# Patient Record
Sex: Female | Born: 1955 | Race: Black or African American | Hispanic: No | Marital: Single | Smoking: Never smoker
Health system: Southern US, Community
[De-identification: ages and names within clinical notes are randomized; demographics above are authoritative.]

## PROBLEM LIST (undated history)

## (undated) DIAGNOSIS — M810 Age-related osteoporosis without current pathological fracture: Secondary | ICD-10-CM

## (undated) DIAGNOSIS — I10 Essential (primary) hypertension: Secondary | ICD-10-CM

## (undated) DIAGNOSIS — E785 Hyperlipidemia, unspecified: Secondary | ICD-10-CM

## (undated) HISTORY — PX: TONSILLECTOMY: SUR1361

## (undated) HISTORY — PX: KNEE SURGERY: SHX244

## (undated) HISTORY — PX: NASAL SINUS SURGERY: SHX719

## (undated) HISTORY — PX: APPENDECTOMY: SHX54

---

## 2007-11-17 ENCOUNTER — Emergency Department (HOSPITAL_COMMUNITY): Admission: EM | Admit: 2007-11-17 | Discharge: 2007-11-17 | Payer: Self-pay | Admitting: Emergency Medicine

## 2007-12-25 ENCOUNTER — Emergency Department (HOSPITAL_COMMUNITY): Admission: EM | Admit: 2007-12-25 | Discharge: 2007-12-25 | Payer: Self-pay | Admitting: Emergency Medicine

## 2008-09-12 ENCOUNTER — Emergency Department (HOSPITAL_COMMUNITY): Admission: EM | Admit: 2008-09-12 | Discharge: 2008-09-12 | Payer: Self-pay | Admitting: Emergency Medicine

## 2008-11-25 ENCOUNTER — Emergency Department (HOSPITAL_COMMUNITY): Admission: EM | Admit: 2008-11-25 | Discharge: 2008-11-25 | Payer: Self-pay | Admitting: Emergency Medicine

## 2008-12-22 ENCOUNTER — Ambulatory Visit (HOSPITAL_COMMUNITY): Admission: RE | Admit: 2008-12-22 | Discharge: 2008-12-22 | Payer: Self-pay | Admitting: Orthopaedic Surgery

## 2008-12-28 ENCOUNTER — Inpatient Hospital Stay (HOSPITAL_COMMUNITY): Admission: RE | Admit: 2008-12-28 | Discharge: 2008-12-29 | Payer: Self-pay | Admitting: Orthopaedic Surgery

## 2009-02-22 ENCOUNTER — Encounter: Admission: RE | Admit: 2009-02-22 | Discharge: 2009-03-21 | Payer: Self-pay | Admitting: Orthopaedic Surgery

## 2011-02-08 LAB — PROTIME-INR: INR: 1 (ref 0.00–1.49)

## 2011-02-13 LAB — COMPREHENSIVE METABOLIC PANEL
Alkaline Phosphatase: 93 U/L (ref 39–117)
CO2: 27 mEq/L (ref 19–32)
Calcium: 9.6 mg/dL (ref 8.4–10.5)
Chloride: 103 mEq/L (ref 96–112)
Creatinine, Ser: 1.16 mg/dL (ref 0.4–1.2)
GFR calc Af Amer: 59 mL/min — ABNORMAL LOW (ref >60)
GFR calc non Af Amer: 49 mL/min — ABNORMAL LOW (ref >60)
Glucose, Bld: 82 mg/dL (ref 70–99)

## 2011-02-13 LAB — URINALYSIS, ROUTINE W REFLEX MICROSCOPIC
Glucose, UA: NEGATIVE mg/dL
Hgb urine dipstick: NEGATIVE
Protein, ur: NEGATIVE mg/dL
Specific Gravity, Urine: 1.02 (ref 1.005–1.030)
pH: 7.5 (ref 5.0–8.0)

## 2011-02-13 LAB — CBC
MCHC: 35 g/dL (ref 30.0–36.0)
MCV: 91.5 fL (ref 78.0–100.0)
Platelets: 315 10*3/uL (ref 150–400)
RDW: 13.3 % (ref 11.5–15.5)

## 2011-02-13 LAB — APTT: aPTT: 30 seconds (ref 24–37)

## 2011-02-13 LAB — PROTIME-INR
INR: 0.9 (ref 0.00–1.49)
Prothrombin Time: 12.3 seconds (ref 11.6–15.2)

## 2011-03-13 NOTE — Op Note (Signed)
Mikayla Moyer, KINNAIRD               ACCOUNT NO.:  [id number]   MEDICAL RECORD NO.:  [id number]          PATIENT TYPE:  OIB   LOCATION:  5032                         FACILITY:  [facility]   PHYSICIAN:  Mark C. Ophelia Charter, M.D.    DATE OF BIRTH:  [birth year]   DATE OF PROCEDURE:  12/27/2008  DATE OF DISCHARGE:                               OPERATIVE REPORT   PREOPERATIVE DIAGNOSIS:  Right patellar tendon rupture.   POSTOPERATIVE DIAGNOSIS:  Right patellar tendon rupture.   PROCEDURE:  Right patellar tendon repair.   SURGEON:  Mark C. Ophelia Charter, MD   TOURNIQUET TIME:  1 hour 44 minutes.   ANESTHESIA:  General anesthesia plus 20 mL Marcaine local.   DRAINS:  None.   INDICATIONS:  This 55 year old female injured her knee on September 12, 2008 when she fell, is stumbling while ambulating on right leg since,  and was seen in the emergency room by the physicians.  At that time, no  apparent orthopedic referral was made.  She then returned later to my  office 2 weeks ago with palpable defect in patellar tendon and sent for  an MRI confirming patellar tendon rupture.  The tendon was rolled up in  a ball, difficult to determine exactly where it was torn but appeared to  be of the tibial attachment.   PROCEDURE IN DETAIL:  After induction of general anesthesia, 2 grams  Ancef, standard prep and draping, usual impervious stockinette, Coban,  and extremity sheets and drapes were applied.  Leg was wrapped in  Esmarch initially 350 which did not work sufficiently.  Tourniquet was  deflated after less than 5 minutes and the leg elevated and then  reinflated at 450.  This did cause arterial compression.  Midline  incision was made.  Tibial tubercle was exposed.  There was extensive  mass of scar tissue from the rupture of the patella tendon which is now  3-1/2 months old.  Patellar tendon appeared be ruptured in the proximal  third.  There was a fluid-filled sac over the top of the tendon.  The  tendon  sheath with adherent to the tendon and a plane was developed  between the superficial retinaculum, deep retinaculum medially and  laterally going through extensive scar tissue which required sharp  scalpel dissection in order to develop a plane.  Defect in the  retinaculum was palpated almost to the medial collateral ligament and  lateral collateral ligament.  Condyles were visible.  Joint was  irrigated.  A #2 FiberWire was used for a figure-of-eight and horizontal  mattress sutures as well as a Kessler type sutures in the capsule both  medial and lateral.  Tajima modification Kessler sutures were placed in  the patellar tendon region, although it was difficult to actually assess  the patellar tendon as it was bound in a mass of scar tissue after 3  months of attempted healing.  Sutures were placed and using the inferior  pole of the patella and the tibial tubercle as the appropriate regions  for repair, tissue was sewed together.  It was overlapped with multiple  #  2 FiberWire figure-of-eight sutures as well.  Knee was able flexed  to 50 degrees with intact repair and was brought back in extension.  Repeat irrigation was performed.  A 2-0 Vicryl in subcutaneous tissue  and skin staple closure.  Marcaine infiltration 20 mL.  Adaptic, 4 x 4s,  ABDs, Webril, and knee immobilizer was applied.  Instrument count and  needle count was correct.      Mark C. Ophelia Charter, M.D.  Electronically Signed     MCY/MEDQ  D:  12/27/2008  T:  12/27/2008  Job:  [id number]

## 2011-11-13 ENCOUNTER — Other Ambulatory Visit: Payer: Self-pay | Admitting: Internal Medicine

## 2011-11-13 DIAGNOSIS — Z1231 Encounter for screening mammogram for malignant neoplasm of breast: Secondary | ICD-10-CM

## 2011-12-07 ENCOUNTER — Ambulatory Visit
Admission: RE | Admit: 2011-12-07 | Discharge: 2011-12-07 | Disposition: A | Discharge status: Home / Self Care | Payer: Medicare Other | Source: Ambulatory Visit | Attending: Internal Medicine | Admitting: Internal Medicine

## 2011-12-07 DIAGNOSIS — Z1231 Encounter for screening mammogram for malignant neoplasm of breast: Secondary | ICD-10-CM

## 2011-12-24 ENCOUNTER — Emergency Department (HOSPITAL_COMMUNITY)
Admission: EM | Admit: 2011-12-24 | Discharge: 2011-12-24 | Disposition: A | Discharge status: Home / Self Care | Payer: Medicare Other | Attending: Emergency Medicine | Admitting: Emergency Medicine

## 2011-12-24 ENCOUNTER — Encounter (HOSPITAL_COMMUNITY): Payer: Self-pay | Admitting: *Deleted

## 2011-12-24 DIAGNOSIS — J329 Chronic sinusitis, unspecified: Secondary | ICD-10-CM | POA: Insufficient documentation

## 2011-12-24 DIAGNOSIS — R070 Pain in throat: Secondary | ICD-10-CM | POA: Insufficient documentation

## 2011-12-24 DIAGNOSIS — I10 Essential (primary) hypertension: Secondary | ICD-10-CM | POA: Insufficient documentation

## 2011-12-24 DIAGNOSIS — Z79899 Other long term (current) drug therapy: Secondary | ICD-10-CM | POA: Insufficient documentation

## 2011-12-24 DIAGNOSIS — J3489 Other specified disorders of nose and nasal sinuses: Secondary | ICD-10-CM | POA: Insufficient documentation

## 2011-12-24 DIAGNOSIS — R0982 Postnasal drip: Secondary | ICD-10-CM | POA: Insufficient documentation

## 2011-12-24 DIAGNOSIS — H9209 Otalgia, unspecified ear: Secondary | ICD-10-CM | POA: Insufficient documentation

## 2011-12-24 DIAGNOSIS — R059 Cough, unspecified: Secondary | ICD-10-CM | POA: Insufficient documentation

## 2011-12-24 DIAGNOSIS — R51 Headache: Secondary | ICD-10-CM | POA: Insufficient documentation

## 2011-12-24 DIAGNOSIS — R220 Localized swelling, mass and lump, head: Secondary | ICD-10-CM | POA: Insufficient documentation

## 2011-12-24 DIAGNOSIS — R05 Cough: Secondary | ICD-10-CM | POA: Insufficient documentation

## 2011-12-24 HISTORY — DX: Essential (primary) hypertension: I10

## 2011-12-24 MED ORDER — AMOXICILLIN 500 MG PO CAPS: 500.0000 mg | ORAL_CAPSULE | Freq: Three times a day (TID) | ORAL | Status: AC

## 2011-12-24 MED ORDER — PREDNISONE (PAK) 10 MG PO TABS: ORAL_TABLET | ORAL | Status: AC

## 2011-12-24 MED ORDER — TRAMADOL HCL 50 MG PO TABS: 50.0000 mg | ORAL_TABLET | Freq: Four times a day (QID) | ORAL | Status: AC | PRN

## 2011-12-24 NOTE — Discharge Instructions (Signed)
How to clear your sinuses:  Use Afrin (and over-the-counter nasal decongestant spray): 1-2 squirts in each nostril. Wait 10 minutes (while using either warm compresses to face, a humidifier, or taking a hot shower) Then do 2 squirts of fluticasone (prescription)  in each nostril. Do this for 1-2 weeks and that should help significantly with sinus congestion.   Sinusitis Sinuses are air pockets within the bones of your face. The growth of bacteria within a sinus leads to infection. The infection prevents the sinuses from draining. This infection is called sinusitis. SYMPTOMS  There will be different areas of pain depending on which sinuses have become infected.  The maxillary sinuses often produce pain beneath the eyes.   Frontal sinusitis may cause pain in the middle of the forehead and above the eyes.  Other problems (symptoms) include:  Toothaches.   Colored, pus-like (purulent) drainage from the nose.   Swelling, warmth, and tenderness over the sinus areas may be signs of infection.  TREATMENT  Sinusitis is most often determined by an exam.X-rays may be taken. If x-rays have been taken, make sure you obtain your results or find out how you are to obtain them. Your caregiver may give you medications (antibiotics). These are medications that will help kill the bacteria causing the infection. You may also be given a medication (decongestant) that helps to reduce sinus swelling.  HOME CARE INSTRUCTIONS   Only take over-the-counter or prescription medicines for pain, discomfort, or fever as directed by your caregiver.   Drink extra fluids. Fluids help thin the mucus so your sinuses can drain more easily.   Applying either moist heat or ice packs to the sinus areas may help relieve discomfort.   Use saline nasal sprays to help moisten your sinuses. The sprays can be found at your local drugstore.  SEEK IMMEDIATE MEDICAL CARE IF:  You have a fever.   You have increasing pain, severe  headaches, or toothache.   You have nausea, vomiting, or drowsiness.   You develop unusual swelling around the face or trouble seeing.  MAKE SURE YOU:   Understand these instructions.   Will watch your condition.   Will get help right away if you are not doing well or get worse.  Document Released: 10/15/2005 Document Revised: 06/27/2011 Document Reviewed: 05/14/2007 [facility] Patient Information 2012 [place], [place].

## 2011-12-24 NOTE — ED Provider Notes (Signed)
History     CSN: [id number]  Arrival date & time 12/24/11  1204   First MD Initiated Contact with Patient 12/24/11 1253      1:58 PM HPI Patient reports chronic sinus infections 20 years. Reports her latest infection began approximately 3 weeks ago. Symptoms are worse and now has a severe frontal sinus headache, nasal drainage and a productive cough. Denies fever, shortness of breath, chest pain, neck, numbness, tingling, weakness, aphasia, ataxia. Reports trying over-the-counter Mucinex, fluticasone, over-the-counter sinus medication,and allergy medicine. Reports no improvement. Patient is a 56 y.o. female presenting with sinusitis. The history is provided by the patient.  Sinusitis  This is a chronic problem. Episode onset: 3 weeks ago. The problem has been gradually worsening. There has been no fever. The pain is severe. The pain has been worsening since onset. Associated symptoms include congestion, ear pain, sinus pressure, sore throat and cough. Pertinent negatives include no chills, no sweats and no shortness of breath. The treatment provided no relief.    Past Medical History  Diagnosis Date  . Hypertension     Past Surgical History  Procedure Date  . Knee surgery     No family history on file.  History  Substance Use Topics  . Smoking status: Never Smoker   . Smokeless tobacco: Not on file  . Alcohol Use: No    OB History    Grav Para Term Preterm Abortions TAB SAB Ect Mult Living                  Review of Systems  Constitutional: Negative for fever and chills.  HENT: Positive for ear pain, congestion, sore throat, rhinorrhea, postnasal drip and sinus pressure. Negative for trouble swallowing, neck pain and neck stiffness.   Eyes: Negative for photophobia and visual disturbance.  Respiratory: Positive for cough. Negative for shortness of breath and wheezing.   Cardiovascular: Negative for chest pain and leg swelling.  Gastrointestinal: Negative for nausea,  vomiting, abdominal pain and diarrhea.  Neurological: Positive for headaches. Negative for dizziness, speech difficulty, weakness and numbness.  All other systems reviewed and are negative.    Allergies  Review of patient's allergies indicates not on file.  Home Medications   Current Outpatient Rx  Name Route Sig Dispense Refill  . ACETAMINOPHEN 500 MG PO TABS Oral Take 1,000 mg by mouth every 6 (six) hours as needed. pain    . GOODY HEADACHE PO Oral Take 1 packet by mouth every 6 (six) hours. pain    . ESOMEPRAZOLE MAGNESIUM 40 MG PO CPDR Oral Take 40 mg by mouth daily before breakfast.    . FLUTICASONE PROPIONATE 50 MCG/ACT NA SUSP Nasal Place 2 sprays into the nose daily.    . GUAIFENESIN ER 600 MG PO TB12 Oral Take 1,200 mg by mouth 2 (two) times daily.    Marland Kitchen HYDROCOD POLST-CPM POLST ER 10-8 MG PO CP12 Oral Take 1 capsule by mouth every 12 (twelve) hours.    . IBUPROFEN 200 MG PO TABS Oral Take 600 mg by mouth every 6 (six) hours as needed. pain    . LISINOPRIL-HYDROCHLOROTHIAZIDE 20-25 MG PO TABS Oral Take 1 tablet by mouth daily.    Marland Kitchen LORATADINE 10 MG PO TABS Oral Take 10 mg by mouth daily.      BP 163/94  Pulse 99  Temp(Src) 98.2 F (36.8 C) (Oral)  Resp 18  Ht 5\' 6"  (1.676 m)  Wt 280 lb (127.007 kg)  BMI 45.19 kg/m2  SpO2  97%  LMP 11/23/2011  Physical Exam  Vitals reviewed. Constitutional: She is oriented to person, place, and time. Vital signs are normal. She appears well-developed and well-nourished. No distress.  HENT:  Head: Normocephalic and atraumatic. No trismus in the jaw.  Right Ear: Hearing, tympanic membrane, external ear and ear canal normal.  Left Ear: Hearing, tympanic membrane, external ear and ear canal normal.  Nose: Mucosal edema and rhinorrhea present. Right sinus exhibits maxillary sinus tenderness and frontal sinus tenderness. Left sinus exhibits maxillary sinus tenderness and frontal sinus tenderness.  Mouth/Throat: Uvula is midline and  oropharynx is clear and moist. Normal dentition. No dental abscesses or uvula swelling. No oropharyngeal exudate, posterior oropharyngeal edema, posterior oropharyngeal erythema or tonsillar abscesses.  Eyes: Pupils are equal, round, and reactive to light.  Neck: Neck supple.  Pulmonary/Chest: Effort normal.  Neurological: She is alert and oriented to person, place, and time.  Skin: Skin is warm and dry. No rash noted. No erythema. No pallor.  Psychiatric: She has a normal mood and affect. Her behavior is normal.    ED Course  Procedures   MDM   We'll treat for sinus infection. Advised followup with ear nose throat. And I have provided preferable to to chronic nature of sinusitis.       Thomasene Lot, PA-C 12/24/11 1407

## 2011-12-24 NOTE — Progress Notes (Signed)
Pt confirms pcp is [facility]

## 2011-12-24 NOTE — ED Notes (Signed)
Pt states she has been going to doctor for the last 3 weeks for her sinus. Pt states she thinks her cold has gotten worst. Pt c/o feeling congested, headache, and productive cough. Pt denies any chest  Pain.

## 2011-12-24 NOTE — ED Provider Notes (Signed)
Medical screening examination/treatment/procedure(s) were performed by non-physician practitioner and as supervising physician I was immediately available for consultation/collaboration.   Creg Gilmer, MD 12/24/11 1512 

## 2012-02-13 ENCOUNTER — Other Ambulatory Visit: Payer: Self-pay | Admitting: Otolaryngology

## 2012-02-13 ENCOUNTER — Ambulatory Visit
Admission: RE | Admit: 2012-02-13 | Discharge: 2012-02-13 | Disposition: A | Discharge status: Home / Self Care | Payer: Medicare Other | Source: Ambulatory Visit | Attending: Otolaryngology | Admitting: Otolaryngology

## 2012-02-13 DIAGNOSIS — J322 Chronic ethmoidal sinusitis: Secondary | ICD-10-CM

## 2012-02-13 DIAGNOSIS — J320 Chronic maxillary sinusitis: Secondary | ICD-10-CM

## 2012-02-13 DIAGNOSIS — R51 Headache: Secondary | ICD-10-CM

## 2012-02-13 DIAGNOSIS — J329 Chronic sinusitis, unspecified: Secondary | ICD-10-CM

## 2012-02-20 ENCOUNTER — Other Ambulatory Visit: Payer: Self-pay | Admitting: Otolaryngology

## 2012-07-28 ENCOUNTER — Emergency Department (HOSPITAL_COMMUNITY)
Admission: EM | Admit: 2012-07-28 | Discharge: 2012-07-29 | Disposition: A | Discharge status: Home / Self Care | Payer: Medicare Other | Attending: Emergency Medicine | Admitting: Emergency Medicine

## 2012-07-28 ENCOUNTER — Encounter (HOSPITAL_COMMUNITY): Payer: Self-pay | Admitting: *Deleted

## 2012-07-28 ENCOUNTER — Emergency Department (HOSPITAL_COMMUNITY): Payer: Medicare Other

## 2012-07-28 DIAGNOSIS — T148XXA Other injury of unspecified body region, initial encounter: Secondary | ICD-10-CM

## 2012-07-28 DIAGNOSIS — I10 Essential (primary) hypertension: Secondary | ICD-10-CM | POA: Insufficient documentation

## 2012-07-28 DIAGNOSIS — M25559 Pain in unspecified hip: Secondary | ICD-10-CM | POA: Insufficient documentation

## 2012-07-28 DIAGNOSIS — T1490XA Injury, unspecified, initial encounter: Secondary | ICD-10-CM | POA: Insufficient documentation

## 2012-07-28 DIAGNOSIS — M549 Dorsalgia, unspecified: Secondary | ICD-10-CM | POA: Insufficient documentation

## 2012-07-28 DIAGNOSIS — M79609 Pain in unspecified limb: Secondary | ICD-10-CM | POA: Insufficient documentation

## 2012-07-28 DIAGNOSIS — M25519 Pain in unspecified shoulder: Secondary | ICD-10-CM | POA: Insufficient documentation

## 2012-07-28 DIAGNOSIS — M542 Cervicalgia: Secondary | ICD-10-CM | POA: Insufficient documentation

## 2012-07-28 MED ORDER — ACETAMINOPHEN 500 MG PO TABS
1000.0000 mg | ORAL_TABLET | Freq: Once | ORAL | Status: AC
  Administered 2012-07-28: 1000 mg via ORAL
  Filled 2012-07-28: qty 2

## 2012-07-28 NOTE — ED Notes (Signed)
Patient transported to X-ray 

## 2012-07-28 NOTE — ED Notes (Signed)
Patient transported to CT 
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
## 2012-07-29 ENCOUNTER — Emergency Department (HOSPITAL_COMMUNITY): Payer: Medicare Other

## 2012-07-29 MED ORDER — DIAZEPAM 5 MG PO TABS: 5.0000 mg | ORAL_TABLET | Freq: Two times a day (BID) | ORAL | Status: DC

## 2012-07-29 MED ORDER — IBUPROFEN 600 MG PO TABS: 600.0000 mg | ORAL_TABLET | Freq: Four times a day (QID) | ORAL | Status: DC | PRN

## 2012-07-29 NOTE — ED Provider Notes (Signed)
Medical screening examination/treatment/procedure(s) were performed by non-physician practitioner and as supervising physician I was immediately available for consultation/collaboration.   Carleene Cooper III, MD 07/29/12 1210

## 2012-08-07 ENCOUNTER — Emergency Department (HOSPITAL_COMMUNITY)
Admission: EM | Admit: 2012-08-07 | Discharge: 2012-08-07 | Disposition: A | Discharge status: Home / Self Care | Payer: Medicare Other | Attending: Emergency Medicine | Admitting: Emergency Medicine

## 2012-08-07 ENCOUNTER — Encounter (HOSPITAL_COMMUNITY): Payer: Self-pay | Admitting: *Deleted

## 2012-08-07 DIAGNOSIS — I10 Essential (primary) hypertension: Secondary | ICD-10-CM | POA: Insufficient documentation

## 2012-08-07 DIAGNOSIS — H00019 Hordeolum externum unspecified eye, unspecified eyelid: Secondary | ICD-10-CM | POA: Insufficient documentation

## 2012-08-07 MED ORDER — ERYTHROMYCIN 5 MG/GM OP OINT
TOPICAL_OINTMENT | Freq: Four times a day (QID) | OPHTHALMIC | Status: DC
  Administered 2012-08-07: 23:00:00 via OPHTHALMIC
  Filled 2012-08-07: qty 1

## 2012-08-07 NOTE — ED Notes (Signed)
Patient's left  eye is swollen and the left side hurts.  Patient ate some mixed nuts and she thinks she is having a reaction to the nuts.  No oral swelling or respiratory problems

## 2012-08-07 NOTE — ED Provider Notes (Signed)
History     CSN: [id number]  Arrival date & time 08/07/12  2046   First MD Initiated Contact with Patient 08/07/12 2157      Chief Complaint  Patient presents with  . Allergic Reaction     Patient is a 56 y.o. female presenting with eye pain. The history is provided by the patient.  Eye Pain This is a new problem. Episode onset: today. The problem occurs constantly. The problem has been gradually worsening. Pertinent negatives include no shortness of breath. Exacerbated by: palpation. The symptoms are relieved by rest. She has tried rest for the symptoms.  pt reports left eye pain/swelling.  She thinks it is an allergic reaction.  No fever/vomiting.  No difficulty breathing.  No tongue/throat swelling  Past Medical History  Diagnosis Date  . Hypertension     Past Surgical History  Procedure Date  . Knee surgery     No family history on file.  History  Substance Use Topics  . Smoking status: Never Smoker   . Smokeless tobacco: Not on file  . Alcohol Use: No    OB History    Grav Para Term Preterm Abortions TAB SAB Ect Mult Living                  Review of Systems  Eyes: Positive for pain.  Respiratory: Negative for shortness of breath.     Allergies  Review of patient's allergies indicates no known allergies.  Home Medications   Current Outpatient Rx  Name Route Sig Dispense Refill  . ACETAMINOPHEN 500 MG PO TABS Oral Take 1,000 mg by mouth every 6 (six) hours as needed. For pain    . AMLODIPINE BESYLATE 10 MG PO TABS Oral Take 10 mg by mouth daily.    Marlin Canary HEADACHE PO Oral Take 1 packet by mouth every 6 (six) hours. pain    . IBUPROFEN 600 MG PO TABS Oral Take 600 mg by mouth every 6 (six) hours as needed. For pain    . LORATADINE 10 MG PO TABS Oral Take 10 mg by mouth at bedtime as needed. For allergies    . OLMESARTAN MEDOXOMIL-HCTZ 20-12.5 MG PO TABS Oral Take 1 tablet by mouth daily.    Marland Kitchen PRAVASTATIN SODIUM 40 MG PO TABS Oral Take 40 mg by mouth  at bedtime.    Marland Kitchen VITAMIN D (ERGOCALCIFEROL) 50000 UNITS PO CAPS Oral Take 50,000 Units by mouth every 7 (seven) days. On Mondays      BP 181/98  Pulse 91  Temp 98.3 F (36.8 C) (Oral)  Resp 20  SpO2 97%  LMP 07/28/2012  Physical Exam CONSTITUTIONAL: Well developed/well nourished HEAD AND FACE: Normocephalic/atraumatic EYES: EOMI/PERRL.  Hordeolum noted to left lower eyelid.  Erythema noted and tenderness.  No erythema/proptosis noted ENMT: Mucous membranes moist, no angioedema NECK: supple no meningeal signs CV: S1/S2 noted LUNGS: Lungs are clear to auscultation bilaterally, no apparent distress ABDOMEN: soft, nontender, no rebound or guarding NEURO: Pt is awake/alert, moves all extremitiesx4 EXTREMITIES: pulses normal, full ROM SKIN: warm, color normal PSYCH: no abnormalities of mood noted  ED Course  Procedures   1. Hordeolum       MDM  Nursing notes including past medical history and social history reviewed and considered in documentation         Joya Gaskins, MD 08/07/12 2357

## 2012-10-13 ENCOUNTER — Observation Stay (HOSPITAL_COMMUNITY)
Admission: EM | Admit: 2012-10-13 | Discharge: 2012-10-13 | Disposition: A | Discharge status: Home / Self Care | Payer: Medicare Other | Attending: Emergency Medicine | Admitting: Emergency Medicine

## 2012-10-13 ENCOUNTER — Encounter (HOSPITAL_COMMUNITY): Payer: Self-pay | Admitting: *Deleted

## 2012-10-13 ENCOUNTER — Observation Stay (HOSPITAL_COMMUNITY): Payer: Medicare Other

## 2012-10-13 DIAGNOSIS — Z7902 Long term (current) use of antithrombotics/antiplatelets: Secondary | ICD-10-CM | POA: Insufficient documentation

## 2012-10-13 DIAGNOSIS — M25559 Pain in unspecified hip: Principal | ICD-10-CM | POA: Insufficient documentation

## 2012-10-13 DIAGNOSIS — M545 Low back pain, unspecified: Secondary | ICD-10-CM | POA: Insufficient documentation

## 2012-10-13 DIAGNOSIS — M549 Dorsalgia, unspecified: Secondary | ICD-10-CM

## 2012-10-13 DIAGNOSIS — M25569 Pain in unspecified knee: Secondary | ICD-10-CM | POA: Insufficient documentation

## 2012-10-13 DIAGNOSIS — Y92009 Unspecified place in unspecified non-institutional (private) residence as the place of occurrence of the external cause: Secondary | ICD-10-CM | POA: Insufficient documentation

## 2012-10-13 DIAGNOSIS — I10 Essential (primary) hypertension: Secondary | ICD-10-CM | POA: Insufficient documentation

## 2012-10-13 DIAGNOSIS — W19XXXA Unspecified fall, initial encounter: Secondary | ICD-10-CM | POA: Insufficient documentation

## 2012-10-13 DIAGNOSIS — M79609 Pain in unspecified limb: Secondary | ICD-10-CM | POA: Insufficient documentation

## 2012-10-13 DIAGNOSIS — M79676 Pain in unspecified toe(s): Secondary | ICD-10-CM

## 2012-10-13 DIAGNOSIS — Z79899 Other long term (current) drug therapy: Secondary | ICD-10-CM | POA: Insufficient documentation

## 2012-10-13 MED ORDER — HYDROCODONE-ACETAMINOPHEN 5-325 MG PO TABS: 1.0000 | ORAL_TABLET | Freq: Four times a day (QID) | ORAL | Status: DC | PRN

## 2012-10-13 MED ORDER — HYDROMORPHONE HCL PF 1 MG/ML IJ SOLN
1.0000 mg | Freq: Once | INTRAMUSCULAR | Status: AC
  Administered 2012-10-13: 1 mg via INTRAVENOUS
  Filled 2012-10-13: qty 1

## 2012-10-13 NOTE — Progress Notes (Signed)
Orthopedic Tech Progress Note Patient Details:  Mikayla Moyer 1955/12/11 [id number] Post op shoe applied to Left LE with care instructions. Ortho Devices Type of Ortho Device: Postop shoe/boot Ortho Device/Splint Location: Left Ortho Device/Splint Interventions: Application   Asia R Thompson 10/13/2012, 3:32 PM

## 2012-10-13 NOTE — ED Provider Notes (Signed)
Medical screening examination/treatment/procedure(s) were performed by non-physician practitioner and as supervising physician I was immediately available for consultation/collaboration.   Glynn Octave, MD 10/13/12 [phone]

## 2012-10-13 NOTE — ED Provider Notes (Signed)
History   This chart was scribed for Doug Sou, MD by Charolett Bumpers, ED Scribe. The patient was seen in room TR07C/TR07C. Patient's care was started at 1040.   CSN: [id number]  Arrival date & time 10/13/12  1005  First MD Initiated Contact with Patient 10/13/2012 1040    Chief Complaint  Patient presents with  . Fall    The history is provided by the patient. No language interpreter was used.  Mikayla Moyer is a 56 y.o. female who presents to the Emergency Department complaining of constant, moderate left hip, knee and foot pain after falling this morning. She states she fell while going to the bathroom after her left knee gave out. She states she has been ambulatory but with a limp. Her symptoms are aggravated with weight bearing. She also complains of lower back pain. She states she has a h/o right knee surgery preformed by Dr. Ophelia Charter and a h/o HTN. She has not tried anything for her symptoms.  Pain is severe, worse with movement improved with remaining still. No treatment prior to coming here. She denies any tobacco, drug and alcohol use.    Past Medical History  Diagnosis Date  . Hypertension     Past Surgical History  Procedure Date  . Knee surgery     History reviewed. No pertinent family history.  History  Substance Use Topics  . Smoking status: Never Smoker   . Smokeless tobacco: Not on file  . Alcohol Use: No    OB History    Grav Para Term Preterm Abortions TAB SAB Ect Mult Living                  Review of Systems  Constitutional: Negative.   HENT: Negative.   Respiratory: Negative.   Cardiovascular: Negative.   Gastrointestinal: Negative.   Musculoskeletal: Positive for back pain and arthralgias.       Left hip, knee and foot pain.   Skin: Negative.   Neurological: Negative.   Hematological: Negative.   Psychiatric/Behavioral: Negative.     Allergies  Review of patient's allergies indicates no known allergies.  Home Medications    Current Outpatient Rx  Name  Route  Sig  Dispense  Refill  . ACETAMINOPHEN 500 MG PO TABS   Oral   Take 1,000 mg by mouth every 6 (six) hours as needed. For pain         . AMLODIPINE BESYLATE 10 MG PO TABS   Oral   Take 10 mg by mouth daily.         Marlin Canary HEADACHE PO   Oral   Take 1 packet by mouth every 6 (six) hours. pain         . IBUPROFEN 600 MG PO TABS   Oral   Take 600 mg by mouth every 6 (six) hours as needed. For pain         . LORATADINE 10 MG PO TABS   Oral   Take 10 mg by mouth at bedtime as needed. For allergies         . OLMESARTAN MEDOXOMIL-HCTZ 20-12.5 MG PO TABS   Oral   Take 1 tablet by mouth daily.         Marland Kitchen PRAVASTATIN SODIUM 40 MG PO TABS   Oral   Take 40 mg by mouth at bedtime.         Marland Kitchen VITAMIN D (ERGOCALCIFEROL) 50000 UNITS PO CAPS   Oral   Take 50,000 Units  by mouth every 7 (seven) days. On Mondays           BP 154/94  Pulse 96  Temp 98 F (36.7 C) (Oral)  Resp 22  SpO2 96%  LMP 09/28/2012  Physical Exam  Nursing note and vitals reviewed. Constitutional: She appears well-developed and well-nourished. She appears distressed.       Marland Kitchen Appears uncomfortable  HENT:  Head: Normocephalic and atraumatic.  Eyes: Conjunctivae normal are normal. Pupils are equal, round, and reactive to light.  Neck: Neck supple. No tracheal deviation present. No thyromegaly present.  Cardiovascular: Normal rate and regular rhythm.   No murmur heard. Pulmonary/Chest: Effort normal and breath sounds normal.  Abdominal: Soft. Bowel sounds are normal. She exhibits no distension. There is no tenderness.       Obese  Musculoskeletal: Normal range of motion. She exhibits tenderness. She exhibits no edema.       Left lower extremity Pain with internal rotation of the left hip, No shortening. No external rotation knee without swelling deformity or tenderness. Foot minimally tender at 15  no swelling. Back tender lumbar spine no deformity all other  extremities are contusion abrasion or tenderness neurovascularly intact  Neurological: She is alert. Coordination normal.  Skin: Skin is warm and dry. No rash noted.  Psychiatric: She has a normal mood and affect.    ED Course  Procedures (including critical care time)  DIAGNOSTIC STUDIES: Oxygen Saturation is 96% on room air, adequate by my interpretation.    COORDINATION OF CARE:  10:43-Discussed planned course of treatment with the patient including including pain medication and x-rays, who is agreeable at this time. Pt did not drive herself to ED.    Labs Reviewed - No data to display No results found.   No diagnosis found.    MDM  Plan analgesia. Plain x-rays of hip and lumbar spine. If x-rays negative patient will need further imaging such as MRI to rule out occult rib fracture     I personally performed the services described in this documentation, which was scribed in my presence. The recorded information has been reviewed and is accurate.      Doug Sou, MD 10/13/12 1050

## 2012-10-13 NOTE — ED Notes (Signed)
Reports getting up to restroom this am, her knees gave out and she had a fall. Having pain to left hip, knee and foot.

## 2012-10-13 NOTE — ED Notes (Signed)
ORTHO IS AWARE PT NEEDS POST OP SHOE

## 2012-10-13 NOTE — ED Provider Notes (Signed)
This is a 56 year old female, who presents emergency department with chief complaint of fall. She was initially seen by Dr. Rennis Chris in fast track. She was moved to CDU to await imaging. Dr. Rennis Chris tells me that if the hip x-ray is negative, that we will need to order an MRI of the hip to rule out a cold fracture. Patient's pain is currently controlled. She denies symptoms while at rest. She states that the pain will come back if she notes.  PE: Gen: A&O x4 HEENT: PERRL, EOM intact CHEST: RRR, no m/r/g LUNGS: CTAB, no w/r/r ABD: BS x 4, ND/NT EXT: Low back pain, low back and paraspinal muscles tender to palpation, left hip tender to palpation, range of motion and strength deferred secondary to pain. NEURO: Sensation and strength intact bilaterally  Plan: Imaging, discharge pending.  2:11 PM Plain films are negative. Will order MRI of left hip.  Results for orders placed during the hospital encounter of 12/28/08  [facility]      Component Value Range   Prothrombin Time 13.5  11.6 - 15.2 seconds   INR 1.0  0.00 - 1.49  CBC      Component Value Range   WBC 8.0  4.0 - 10.5 K/uL   RBC 4.38  3.87 - 5.11 MIL/uL   Hemoglobin 14.0  12.0 - 15.0 g/dL   HCT 40.9  81.1 - 91.4 %   MCV 91.5  78.0 - 100.0 fL   MCHC 35.0  30.0 - 36.0 g/dL   RDW 78.2  95.6 - 21.3 %   Platelets 315  150 - 400 K/uL  COMPREHENSIVE METABOLIC PANEL      Component Value Range   Sodium 136  135 - 145 mEq/L   Potassium 4.3  3.5 - 5.1 mEq/L   Chloride 103  96 - 112 mEq/L   CO2 27  19 - 32 mEq/L   Glucose, Bld 82  70 - 99 mg/dL   BUN 11  6 - 23 mg/dL   Creatinine, Ser 0.86  0.4 - 1.2 mg/dL   Calcium 9.6  8.4 - 57.8 mg/dL   Total Protein 7.4  6.0 - 8.3 g/dL   Albumin 3.5  3.5 - 5.2 g/dL   AST 21  0 - 37 U/L   ALT 25  0 - 35 U/L   Alkaline Phosphatase 93  39 - 117 U/L   Total Bilirubin 0.5  0.3 - 1.2 mg/dL   GFR calc non Af Amer 49 (*) >60 mL/min   GFR calc Af Amer   (*) >60 mL/min   Value: 59          The eGFR has been calculated     using the MDRD equation.     This calculation has not been     validated in all clinical     situations.     eGFR's persistently     <60 mL/min signify     possible Chronic Kidney Disease.  PROTIME-INR      Component Value Range   Prothrombin Time 12.3  11.6 - 15.2 seconds   INR 0.9  0.00 - 1.49  APTT      Component Value Range   aPTT 30  24 - 37 seconds  URINALYSIS, ROUTINE W REFLEX MICROSCOPIC      Component Value Range   Color, Urine YELLOW  YELLOW   APPearance CLEAR  CLEAR   Specific Gravity, Urine 1.020  1.005 - 1.030   pH 7.5  5.0 -  8.0   Glucose, UA NEGATIVE  NEGATIVE mg/dL   Hgb urine dipstick NEGATIVE  NEGATIVE   Bilirubin Urine NEGATIVE  NEGATIVE   Ketones, ur NEGATIVE  NEGATIVE mg/dL   Protein, ur NEGATIVE  NEGATIVE mg/dL   Urobilinogen, UA 1.0  0.0 - 1.0 mg/dL   Nitrite NEGATIVE  NEGATIVE   Leukocytes, UA    NEGATIVE   Value: NEGATIVE MICROSCOPIC NOT DONE ON URINES WITH NEGATIVE PROTEIN, BLOOD, LEUKOCYTES, NITRITE, OR GLUCOSE <1000 mg/dL.   Dg Lumbar Spine Complete  10/13/2012  *RADIOLOGY REPORT*  Clinical Data: Larey Seat.  Back pain.  LUMBAR SPINE - COMPLETE 4+ VIEW  Comparison: None  Findings: The lateral film demonstrates normal alignment. Vertebral bodies and disc spaces are maintained.  No acute bony findings.  Normal alignment of the facet joints and no pars defects.  The visualized bony pelvis in intact.  IMPRESSION: Normal alignment and no acute bony findings.  Minimal degenerative changes.   Original Report Authenticated By: Rudie Meyer, M.D.    Dg Hip Complete Left  10/13/2012  *RADIOLOGY REPORT*  Clinical Data: Larey Seat.  Left hip pain.  LEFT HIP - COMPLETE 2+ VIEW  Comparison: None  Findings: Both hips are normally located.  Minimal degenerative changes bilaterally.  No acute fracture or plain film evidence of avascular necrosis.  The pubic symphysis and SI joints are intact.  IMPRESSION: No acute bony findings.   Original Report  Authenticated By: Rudie Meyer, M.D.    Mr Hip Left Wo Contrast  10/13/2012  *RADIOLOGY REPORT*  Clinical Data: Status post fall.  Left hip, knee and foot pain.  MRI OF THE LEFT HIP WITHOUT CONTRAST  Technique:  Multiplanar, multisequence MR imaging was performed. No intravenous contrast was administered.  Comparison: Plain films earlier this same date.  Findings: There is no fracture or dislocation of either of hip or the remainder the pelvis.  No avascular necrosis of the hips is identified.  There is no hip joint effusion.  No evidence of bursitis is seen.  There is no notable degenerative disease about the hips.  No labral tear is identified.  Visualized lower lumbar spine is unremarkable.  Imaged muscles and tendons appear normal. Visualized intrapelvic contents appear normal.  IMPRESSION: Negative exam.   Original Report Authenticated By: Holley Dexter, M.D.     3:00 PM MR is negative. I'm going to discharge the patient to home with orthopedic or primary care followup. I will give the patient a postop shoe for her toe pain. Will give the patient pain medicine and crutches. Patient understands and agrees with the plan. She is stable and ready for discharge.  3:06 PM Patient has a walker at home, she will use this instead of crutches.    Roxy Horseman, PA-C 10/13/12 1506

## 2013-01-27 ENCOUNTER — Other Ambulatory Visit: Payer: Self-pay

## 2013-01-27 DIAGNOSIS — Z1231 Encounter for screening mammogram for malignant neoplasm of breast: Secondary | ICD-10-CM

## 2013-02-18 ENCOUNTER — Emergency Department (HOSPITAL_COMMUNITY): Payer: Medicare Other

## 2013-02-18 ENCOUNTER — Emergency Department (HOSPITAL_COMMUNITY)
Admission: EM | Admit: 2013-02-18 | Discharge: 2013-02-18 | Disposition: A | Discharge status: Home / Self Care | Payer: Medicare Other | Attending: Emergency Medicine | Admitting: Emergency Medicine

## 2013-02-18 ENCOUNTER — Encounter (HOSPITAL_COMMUNITY): Payer: Self-pay | Admitting: *Deleted

## 2013-02-18 DIAGNOSIS — R059 Cough, unspecified: Secondary | ICD-10-CM | POA: Insufficient documentation

## 2013-02-18 DIAGNOSIS — M79602 Pain in left arm: Secondary | ICD-10-CM

## 2013-02-18 DIAGNOSIS — R05 Cough: Secondary | ICD-10-CM | POA: Insufficient documentation

## 2013-02-18 DIAGNOSIS — Z8739 Personal history of other diseases of the musculoskeletal system and connective tissue: Secondary | ICD-10-CM | POA: Insufficient documentation

## 2013-02-18 DIAGNOSIS — M549 Dorsalgia, unspecified: Secondary | ICD-10-CM | POA: Insufficient documentation

## 2013-02-18 DIAGNOSIS — R0789 Other chest pain: Secondary | ICD-10-CM

## 2013-02-18 DIAGNOSIS — R071 Chest pain on breathing: Secondary | ICD-10-CM | POA: Insufficient documentation

## 2013-02-18 DIAGNOSIS — M79609 Pain in unspecified limb: Secondary | ICD-10-CM | POA: Insufficient documentation

## 2013-02-18 DIAGNOSIS — Z79899 Other long term (current) drug therapy: Secondary | ICD-10-CM | POA: Insufficient documentation

## 2013-02-18 DIAGNOSIS — I10 Essential (primary) hypertension: Secondary | ICD-10-CM | POA: Insufficient documentation

## 2013-02-18 DIAGNOSIS — E785 Hyperlipidemia, unspecified: Secondary | ICD-10-CM | POA: Insufficient documentation

## 2013-02-18 HISTORY — DX: Hyperlipidemia, unspecified: E78.5

## 2013-02-18 HISTORY — DX: Age-related osteoporosis without current pathological fracture: M81.0

## 2013-02-18 LAB — URINALYSIS, ROUTINE W REFLEX MICROSCOPIC
Bilirubin Urine: NEGATIVE
Glucose, UA: NEGATIVE mg/dL
Ketones, ur: NEGATIVE mg/dL
Nitrite: NEGATIVE
pH: 7 (ref 5.0–8.0)

## 2013-02-18 LAB — CBC WITH DIFFERENTIAL/PLATELET
Basophils Absolute: 0 10*3/uL (ref 0.0–0.1)
Eosinophils Absolute: 0.2 10*3/uL (ref 0.0–0.7)
Eosinophils Relative: 3 % (ref 0–5)
HCT: 37.7 % (ref 36.0–46.0)
Lymphocytes Relative: 37 % (ref 12–46)
MCH: 30.4 pg (ref 26.0–34.0)
MCV: 87.5 fL (ref 78.0–100.0)
Monocytes Absolute: 0.5 10*3/uL (ref 0.1–1.0)
RDW: 13 % (ref 11.5–15.5)
WBC: 6.5 10*3/uL (ref 4.0–10.5)

## 2013-02-18 LAB — POCT I-STAT, CHEM 8
Creatinine, Ser: 1 mg/dL (ref 0.50–1.10)
HCT: 40 % (ref 36.0–46.0)
Hemoglobin: 13.6 g/dL (ref 12.0–15.0)
Potassium: 3.7 mEq/L (ref 3.5–5.1)
Sodium: 140 mEq/L (ref 135–145)

## 2013-02-18 LAB — RAPID URINE DRUG SCREEN, HOSP PERFORMED
Amphetamines: NOT DETECTED
Tetrahydrocannabinol: NOT DETECTED

## 2013-02-18 LAB — D-DIMER, QUANTITATIVE: D-Dimer, Quant: <0.27 ug/mL-FEU (ref 0.00–0.48)

## 2013-02-18 LAB — POCT I-STAT TROPONIN I

## 2013-02-18 MED ORDER — HYDROCODONE-ACETAMINOPHEN 5-325 MG PO TABS
2.0000 | ORAL_TABLET | Freq: Once | ORAL | Status: AC
  Administered 2013-02-18: 2 via ORAL
  Filled 2013-02-18: qty 2

## 2013-02-18 MED ORDER — HYDROCODONE-ACETAMINOPHEN 5-325 MG PO TABS: 1.0000 | ORAL_TABLET | ORAL | Status: DC | PRN

## 2013-02-18 NOTE — ED Provider Notes (Signed)
History     CSN: [id number]  Arrival date & time 02/18/13  4098   First MD Initiated Contact with Patient 02/18/13 0703      Chief Complaint  Patient presents with  . Chest Pain    (Consider location/radiation/quality/duration/timing/severity/associated sxs/prior treatment) HPI Comments: Mikayla Moyer is a 57 y.o. Female who complains of pain in left arm for 2 days. The pain is sharp and worse with movement. Pain radiates to her left chest. She tried taking aspirin without relief. There has been no trauma. She has had mild non-effective cough. She denies shortness of breath. No headache, weakness, or dizziness. She has mild back pain, which is ongoing. There's been no dyspnea, syncope, or syncope. She denies extremity pain, gait disorder, bowel or urinary changes. There are no other modifying factors  Patient is a 57 y.o. female presenting with chest pain. The history is provided by the patient.  Chest Pain   Past Medical History  Diagnosis Date  . Hypertension   . Hyperlipidemia   . Osteoporosis     Past Surgical History  Procedure Laterality Date  . Knee surgery    . Tonsillectomy    . Appendectomy    . Nasal sinus surgery      No family history on file.  History  Substance Use Topics  . Smoking status: Never Smoker   . Smokeless tobacco: Not on file  . Alcohol Use: No    OB History   Grav Para Term Preterm Abortions TAB SAB Ect Mult Living                  Review of Systems  Cardiovascular: Positive for chest pain.  All other systems reviewed and are negative.    Allergies  Review of patient's allergies indicates no known allergies.  Home Medications   Current Outpatient Rx  Name  Route  Sig  Dispense  Refill  . amLODipine (NORVASC) 5 MG tablet   Oral   Take 5 mg by mouth daily.         Mikayla Moyer Kitchen dexlansoprazole (DEXILANT) 60 MG capsule   Oral   Take 60 mg by mouth daily.         . fish oil-omega-3 fatty acids 1000 MG capsule   Oral   Take 1 g  by mouth 2 (two) times daily.         . Flaxseed, Linseed, (FLAXSEED OIL PO)   Oral   Take 1 capsule by mouth 2 (two) times daily.         . fluticasone (FLONASE) 50 MCG/ACT nasal spray   Nasal   Place 2 sprays into the nose daily.         Mikayla Moyer Kitchen GARLIC PO   Oral   Take 1 tablet by mouth daily.         Mikayla Moyer Kitchen loratadine (CLARITIN) 10 MG tablet   Oral   Take 10 mg by mouth at bedtime as needed. For allergies         . olmesartan-hydrochlorothiazide (BENICAR HCT) 20-12.5 MG per tablet   Oral   Take 1 tablet by mouth daily.         . pravastatin (PRAVACHOL) 40 MG tablet   Oral   Take 40 mg by mouth at bedtime.         . Vitamin D, Ergocalciferol, (DRISDOL) 50000 UNITS CAPS   Oral   Take 50,000 Units by mouth every 7 (seven) days. On Monday         .  Aspirin-Salicylamide-Caffeine (BC HEADACHE POWDER PO)   Oral   Take 1 packet by mouth daily as needed. For headaches         . HYDROcodone-acetaminophen (NORCO) 5-325 MG per tablet   Oral   Take 1 tablet by mouth every 4 (four) hours as needed for pain.   10 tablet   0     BP 100/61  Pulse 72  Temp(Src) 97.7 F (36.5 C) (Oral)  Resp 20  Ht 5\' 6"  (1.676 m)  Wt 290 lb (131.543 kg)  BMI 46.83 kg/m2  SpO2 95%  LMP 02/04/2013  Physical Exam  Nursing note and vitals reviewed. Constitutional: She is oriented to person, place, and time. She appears well-developed and well-nourished.  HENT:  Head: Normocephalic and atraumatic.  Eyes: Conjunctivae and EOM are normal. Pupils are equal, round, and reactive to light.  Neck: Normal range of motion and phonation normal. Neck supple.  Cardiovascular: Normal rate, regular rhythm and intact distal pulses.   Pulmonary/Chest: Effort normal and breath sounds normal. She exhibits tenderness (tender left anterior chest wall appear).  Abdominal: Soft. She exhibits no distension. There is no tenderness. There is no guarding.  Musculoskeletal: Normal range of motion.  Number  range of motion of arms and legs. Movement of left arm causes pain in the left upper arm that radiates to the left chest.  Neurological: She is alert and oriented to person, place, and time. She has normal strength. She exhibits normal muscle tone.  Skin: Skin is warm and dry.  Psychiatric: She has a normal mood and affect. Her behavior is normal. Judgment and thought content normal.    ED Course  Procedures (including critical care time)     Date: 08/15/2012  Rate: 78  Rhythm: normal sinus rhythm  QRS Axis: normal  PR and QT Intervals: normal  ST/T Wave abnormalities: normal  PR and QRS Conduction Disutrbances:none  Narrative Interpretation: LVH  Old EKG Reviewed: unchanged   Labs Reviewed  POCT I-STAT, CHEM 8 - Abnormal; Notable for the following:    Glucose, Bld 127 (*)    All other components within normal limits  CBC WITH DIFFERENTIAL  URINALYSIS, ROUTINE W REFLEX MICROSCOPIC  D-DIMER, QUANTITATIVE  URINE RAPID DRUG SCREEN (HOSP PERFORMED)  POCT I-STAT TROPONIN I   Dg Chest 2 View  02/18/2013  *RADIOLOGY REPORT*  Clinical Data: Mid to left-sided chest pain radiating into the left arm.  CHEST - 2 VIEW  Comparison: 12/24/2008.  Findings:  Cardiopericardial silhouette within normal limits. Mediastinal contours normal. Trachea midline.  No airspace disease or effusion. Monitoring leads are projected over the chest. No pneumothorax.  IMPRESSION: No active cardiopulmonary disease.   Original Report Authenticated By: Andreas Newport, M.D.     Nursing Notes Reviewed/ Care Coordinated, and agree without changes. Applicable Imaging Reviewed.  Interpretation of Laboratory Data incorporated into ED treatment    1. Arm pain, left   2. Chest wall pain       MDM  Musculoskeletal type pain, left arm, and chest. ACS, PE, or pneumonia. Stable for discharge with symptomatic      Plan: Home Medications- Norco; Home Treatments- rest, heat; Recommended follow up- PCP, when  necessary     Flint Melter, MD 02/18/13 1620

## 2013-02-18 NOTE — ED Notes (Signed)
Patient transported to X-ray 

## 2013-02-18 NOTE — ED Notes (Signed)
Pt states that she has had left arm pain for 2 days and that around 0300 the pain began to left side of chest; pt also c/o mild shortness of breath since chest pain began; denies N/V.

## 2013-03-06 ENCOUNTER — Ambulatory Visit
Admission: RE | Admit: 2013-03-06 | Discharge: 2013-03-06 | Disposition: A | Discharge status: Home / Self Care | Payer: Medicare Other | Source: Ambulatory Visit

## 2013-03-06 DIAGNOSIS — Z1231 Encounter for screening mammogram for malignant neoplasm of breast: Secondary | ICD-10-CM

## 2013-07-07 ENCOUNTER — Encounter: Payer: Self-pay | Admitting: Obstetrics

## 2013-07-08 ENCOUNTER — Other Ambulatory Visit: Payer: Self-pay | Admitting: Internal Medicine

## 2013-07-08 DIAGNOSIS — N926 Irregular menstruation, unspecified: Secondary | ICD-10-CM

## 2013-07-10 ENCOUNTER — Ambulatory Visit
Admission: RE | Admit: 2013-07-10 | Discharge: 2013-07-10 | Disposition: A | Discharge status: Home / Self Care | Payer: Medicare Other | Source: Ambulatory Visit | Attending: Internal Medicine | Admitting: Internal Medicine

## 2013-07-10 DIAGNOSIS — N946 Dysmenorrhea, unspecified: Secondary | ICD-10-CM

## 2013-07-10 DIAGNOSIS — N926 Irregular menstruation, unspecified: Secondary | ICD-10-CM

## 2013-07-10 DIAGNOSIS — E669 Obesity, unspecified: Secondary | ICD-10-CM

## 2013-08-06 ENCOUNTER — Encounter: Payer: Self-pay | Admitting: Obstetrics

## 2013-08-06 ENCOUNTER — Ambulatory Visit (INDEPENDENT_AMBULATORY_CARE_PROVIDER_SITE_OTHER): Payer: Medicare Other | Admitting: Obstetrics

## 2013-08-06 VITALS — BP 159/93 | HR 84 | Temp 98.1°F | Ht 66.0 in

## 2013-08-06 DIAGNOSIS — Z124 Encounter for screening for malignant neoplasm of cervix: Secondary | ICD-10-CM

## 2013-08-06 DIAGNOSIS — Z01419 Encounter for gynecological examination (general) (routine) without abnormal findings: Secondary | ICD-10-CM

## 2013-08-06 DIAGNOSIS — N949 Unspecified condition associated with female genital organs and menstrual cycle: Secondary | ICD-10-CM

## 2013-08-06 NOTE — Progress Notes (Signed)
Subjective:     Mikayla Moyer is a 57 y.o. female here for a routine exam.  Current complaints: annual exam.  Personal health questionnaire reviewed: yes.   Gynecologic History Patient's last menstrual period was 07/23/2013. Contraception: abstinence Last Pap: 2010 Results were: normal Last mammogram: 2014. Results were: normal  Obstetric History OB History  No data available     The following portions of the patient's history were reviewed and updated as appropriate: allergies, current medications, past family history, past medical history, past social history, past surgical history and problem list.  Review of Systems Pertinent items are noted in HPI.    Objective:    General appearance: alert and no distress Breasts: normal appearance, no masses or tenderness Abdomen: normal findings: soft, non-tender Pelvic: cervix normal in appearance, external genitalia normal, no adnexal masses or tenderness, no cervical motion tenderness, rectovaginal septum normal, uterus normal size, shape, and consistency and vagina normal without discharge    Assessment:    Healthy female exam.    Plan:    Follow up in: 1 year.

## 2013-08-07 LAB — PAP IG W/ RFLX HPV ASCU

## 2014-06-07 ENCOUNTER — Other Ambulatory Visit: Payer: Self-pay

## 2014-06-07 DIAGNOSIS — Z1231 Encounter for screening mammogram for malignant neoplasm of breast: Secondary | ICD-10-CM

## 2014-06-18 ENCOUNTER — Ambulatory Visit
Admission: RE | Admit: 2014-06-18 | Discharge: 2014-06-18 | Disposition: A | Discharge status: Home / Self Care | Payer: Medicare Other | Source: Ambulatory Visit

## 2014-06-18 ENCOUNTER — Encounter (INDEPENDENT_AMBULATORY_CARE_PROVIDER_SITE_OTHER): Payer: Self-pay

## 2014-06-18 DIAGNOSIS — Z1231 Encounter for screening mammogram for malignant neoplasm of breast: Secondary | ICD-10-CM

## 2014-08-10 ENCOUNTER — Ambulatory Visit: Payer: Medicare Other | Admitting: Obstetrics

## 2014-09-02 ENCOUNTER — Encounter (HOSPITAL_COMMUNITY): Payer: Self-pay | Admitting: Emergency Medicine

## 2014-09-02 ENCOUNTER — Emergency Department (HOSPITAL_COMMUNITY)
Admission: EM | Admit: 2014-09-02 | Discharge: 2014-09-02 | Disposition: A | Discharge status: Home / Self Care | Payer: Medicare Other | Attending: Emergency Medicine | Admitting: Emergency Medicine

## 2014-09-02 DIAGNOSIS — I10 Essential (primary) hypertension: Secondary | ICD-10-CM | POA: Diagnosis not present

## 2014-09-02 DIAGNOSIS — R0981 Nasal congestion: Secondary | ICD-10-CM | POA: Diagnosis not present

## 2014-09-02 DIAGNOSIS — E785 Hyperlipidemia, unspecified: Secondary | ICD-10-CM | POA: Diagnosis not present

## 2014-09-02 DIAGNOSIS — Z79899 Other long term (current) drug therapy: Secondary | ICD-10-CM | POA: Diagnosis not present

## 2014-09-02 DIAGNOSIS — Z8739 Personal history of other diseases of the musculoskeletal system and connective tissue: Secondary | ICD-10-CM | POA: Diagnosis not present

## 2014-09-02 DIAGNOSIS — J029 Acute pharyngitis, unspecified: Secondary | ICD-10-CM | POA: Insufficient documentation

## 2014-09-02 DIAGNOSIS — Z9889 Other specified postprocedural states: Secondary | ICD-10-CM | POA: Diagnosis not present

## 2014-09-02 DIAGNOSIS — Z7951 Long term (current) use of inhaled steroids: Secondary | ICD-10-CM | POA: Diagnosis not present

## 2014-09-02 LAB — RAPID STREP SCREEN (MED CTR MEBANE ONLY): Streptococcus, Group A Screen (Direct): NEGATIVE

## 2014-09-02 MED ORDER — AMOXICILLIN 500 MG PO CAPS: 500.0000 mg | ORAL_CAPSULE | Freq: Three times a day (TID) | ORAL | Status: DC

## 2014-09-02 MED ORDER — HYDROCODONE-ACETAMINOPHEN 7.5-325 MG/15ML PO SOLN
15.0000 mL | Freq: Once | ORAL | Status: AC
  Administered 2014-09-02: 15 mL via ORAL
  Filled 2014-09-02: qty 15

## 2014-09-02 MED ORDER — HYDROCODONE-ACETAMINOPHEN 7.5-325 MG/15ML PO SOLN: 15.0000 mL | ORAL | Status: DC | PRN

## 2014-09-02 NOTE — ED Provider Notes (Signed)
CSN: [id number]     Arrival date & time 09/02/14  0827 History   First MD Initiated Contact with Patient 09/02/14 [phone]     Chief Complaint  Patient presents with  . Sore Throat    headache     (Consider location/radiation/quality/duration/timing/severity/associated sxs/prior Treatment) The history is provided by the patient and medical records.    This is a 58 y.o. F with PMH significant for HTN, HLP, osteoporosis, presenting to the ED for sore throat x 1 week.  Patient also notes sinus congestion/fullness, PND, subjective fever and chills.  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  Notes a dry cough.  No chest pain, SOB, abdominal pain, N/V/D.  States decreased PO intake due to painful swallowing, however patient denies difficulty swallowing or speaking. Patient has had prior tonsillectomy.  VS stable on arrival.  Past Medical History  Diagnosis Date  . Hypertension   . Hyperlipidemia   . Osteoporosis    Past Surgical History  Procedure Laterality Date  . Knee surgery    . Tonsillectomy    . Appendectomy    . Nasal sinus surgery     No family history on file. History  Substance Use Topics  . Smoking status: Never Smoker   . Smokeless tobacco: Not on file  . Alcohol Use: No   OB History    No data available     Review of Systems  HENT: Positive for congestion, postnasal drip, sinus pressure and sore throat.   Respiratory: Positive for cough (dry).   All other systems reviewed and are negative.   Allergies  Review of patient's allergies indicates no known allergies.  Home Medications   Prior to Admission medications   Medication Sig Start Date End Date Taking? Authorizing Provider  amLODipine (NORVASC) 5 MG tablet Take 10 mg by mouth daily.     Historical Provider, MD  fish oil-omega-3 fatty acids 1000 MG capsule Take 1 g by mouth 2 (two) times daily.    Historical Provider, MD  fluticasone (FLONASE) 50 MCG/ACT nasal spray Place  2 sprays into the nose daily.    Historical Provider, MD  olmesartan-hydrochlorothiazide (BENICAR HCT) 20-12.5 MG per tablet Take 1 tablet by mouth daily.    Historical Provider, MD  pravastatin (PRAVACHOL) 40 MG tablet Take 40 mg by mouth at bedtime.    Historical Provider, MD  Vitamin D, Ergocalciferol, (DRISDOL) 50000 UNITS CAPS Take 50,000 Units by mouth every 7 (seven) days. On Monday    Historical Provider, MD   BP 191/99 mmHg  Pulse 75  Temp(Src) 98 F (36.7 C) (Oral)  Resp 14  SpO2 99%  LMP 08/24/2014   Physical Exam  Constitutional: She is oriented to person, place, and time. She appears well-developed and well-nourished. No distress.  HENT:  Head: Normocephalic and atraumatic.  Right Ear: Tympanic membrane and ear canal normal.  Left Ear: Tympanic membrane and ear canal normal.  Nose: Nose normal.  Mouth/Throat: Uvula is midline, oropharynx is clear and moist and mucous membranes are normal. No oropharyngeal exudate, posterior oropharyngeal edema, posterior oropharyngeal erythema or tonsillar abscesses.  Tonsils surgically absent; throat erythematous with PND present; uvula remains midline; handling secretions well; no difficulty swallowing; speaking in full complete sentences without difficulty  Eyes: Conjunctivae and EOM are normal. Pupils are equal, round, and reactive to light.  Neck: Trachea normal, normal range of motion, full passive range of motion without pain and phonation normal. Neck supple. No spinous process tenderness  and no muscular tenderness present. No rigidity. No tracheal deviation, no erythema and normal range of motion present.  No meningismus  Cardiovascular: Normal rate, regular rhythm and normal heart sounds.   Pulmonary/Chest: Effort normal and breath sounds normal. No stridor. No respiratory distress. She has no wheezes.  Musculoskeletal: Normal range of motion.  Lymphadenopathy:    She has cervical adenopathy.  Neurological: She is alert and  oriented to person, place, and time.  Skin: Skin is warm and dry. She is not diaphoretic.  Psychiatric: She has a normal mood and affect.  Nursing note and vitals reviewed.   ED Course  Procedures (including critical care time) Labs Review Labs Reviewed - No data to display  Imaging Review No results found.   EKG Interpretation None      MDM   Final diagnoses:  Sore throat  Nasal congestion   58 y.o. F with URI type sx, most notably sore throat over the past week.  She has had known sick contacts.  On exam, tonsils surgically absent.  Oropharynx does appear erythematous without exudates.  Uvula remains midline without peritonsillar or retropharyngeal abscess. Trachea midline with normal phonation, no stridor or tripoding to suggest epiglottitis. Airway remains clear, she is handling secretions well and speaking in full sentences without difficulty.  VS stable on RA.  Some sinus tenderness noted.  Rapid strep will be obtained, dose of hycet given.  Rapid strep negative, culture pending.  Patient states some improvement after hycet.  Sx may be due to sinusitis vs viral etiology.  Patient will be started on hycet, instructed to monitor sx closely and start course of amoxicillin if no improvement in the next 48 hours.  FU with PCP.  Discussed plan with patient, he/she acknowledged understanding and agreed with plan of care.  Return precautions given for new or worsening symptoms.  Garlon HatchetLisa M Dayvin Aber, PA-C 09/02/14 1244  Warnell Foresterrey Wofford, MD 09/03/14 [phone]

## 2014-09-02 NOTE — ED Notes (Signed)
Pt reports sore throat for 1 week. She also co headache and fever and chills on and off. Pt presents with difficulty speaking and swallowing.

## 2014-09-02 NOTE — Discharge Instructions (Signed)
Take the prescribed medication as directed.  Make sure to drink plenty of fluids to keep yourself hydrated. Follow-up with your primary care physician. Return to the ED for new or worsening symptoms.
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
## 2014-09-06 LAB — CULTURE, GROUP A STREP

## 2014-09-07 ENCOUNTER — Telehealth (HOSPITAL_BASED_OUTPATIENT_CLINIC_OR_DEPARTMENT_OTHER): Payer: Self-pay | Admitting: Emergency Medicine

## 2014-09-07 NOTE — Telephone Encounter (Signed)
Post ED Visit - Positive Culture Follow-up  Culture report reviewed by antimicrobial stewardship pharmacist: []  Wes Dulaney, Pharm.D., BCPS [x]  Celedonio MiyamotoJeremy Frens, Pharm.D., BCPS []  Georgina PillionElizabeth Martin, Pharm.D., BCPS []  [place] PleasantMinh Pham, 17[address] Rainbow BoulevardPharm.D., BCPS, AAHIVP []  Estella HuskMichelle Turner, Pharm.D., BCPS, AAHIVP []  Babs BertinHaley Baird, Pharm.D.   Positive  Strep culture Treated with amoxicillin, organism sensitive to the same and no further patient follow-up is required at this time.  Berle MullMiller, Selah Zelman 09/07/2014, 12:24 PM

## 2014-11-17 DIAGNOSIS — J019 Acute sinusitis, unspecified: Secondary | ICD-10-CM | POA: Diagnosis not present

## 2014-11-17 DIAGNOSIS — E784 Other hyperlipidemia: Secondary | ICD-10-CM | POA: Diagnosis not present

## 2014-11-17 DIAGNOSIS — R7301 Impaired fasting glucose: Secondary | ICD-10-CM | POA: Diagnosis not present

## 2014-11-17 DIAGNOSIS — I10 Essential (primary) hypertension: Secondary | ICD-10-CM | POA: Diagnosis not present

## 2014-11-17 DIAGNOSIS — Z6841 Body Mass Index (BMI) 40.0 and over, adult: Secondary | ICD-10-CM | POA: Diagnosis not present

## 2014-12-20 DIAGNOSIS — I10 Essential (primary) hypertension: Secondary | ICD-10-CM | POA: Diagnosis not present

## 2014-12-20 DIAGNOSIS — R7301 Impaired fasting glucose: Secondary | ICD-10-CM | POA: Diagnosis not present

## 2014-12-20 DIAGNOSIS — J302 Other seasonal allergic rhinitis: Secondary | ICD-10-CM | POA: Diagnosis not present

## 2014-12-20 DIAGNOSIS — E784 Other hyperlipidemia: Secondary | ICD-10-CM | POA: Diagnosis not present

## 2014-12-22 ENCOUNTER — Telehealth: Payer: Self-pay

## 2014-12-22 NOTE — Telephone Encounter (Signed)
PATIENT HAS APPT FOR ANNUAL WITH [facility] ON 3/3/ AT 10AM - LEFT MESSAGE

## 2014-12-30 ENCOUNTER — Ambulatory Visit (INDEPENDENT_AMBULATORY_CARE_PROVIDER_SITE_OTHER): Payer: Medicaid Other | Admitting: Certified Nurse Midwife

## 2014-12-30 DIAGNOSIS — Z0000 Encounter for general adult medical examination without abnormal findings: Secondary | ICD-10-CM

## 2014-12-30 DIAGNOSIS — Z01419 Encounter for gynecological examination (general) (routine) without abnormal findings: Secondary | ICD-10-CM

## 2015-01-04 NOTE — Progress Notes (Signed)
Patient did not stay for exam

## 2015-02-18 DIAGNOSIS — E784 Other hyperlipidemia: Secondary | ICD-10-CM | POA: Diagnosis not present

## 2015-02-18 DIAGNOSIS — K219 Gastro-esophageal reflux disease without esophagitis: Secondary | ICD-10-CM | POA: Diagnosis not present

## 2015-02-18 DIAGNOSIS — J302 Other seasonal allergic rhinitis: Secondary | ICD-10-CM | POA: Diagnosis not present

## 2015-02-18 DIAGNOSIS — I10 Essential (primary) hypertension: Secondary | ICD-10-CM | POA: Diagnosis not present

## 2015-02-18 DIAGNOSIS — R7301 Impaired fasting glucose: Secondary | ICD-10-CM | POA: Diagnosis not present

## 2015-03-23 DIAGNOSIS — M129 Arthropathy, unspecified: Secondary | ICD-10-CM | POA: Diagnosis not present

## 2015-03-23 DIAGNOSIS — E784 Other hyperlipidemia: Secondary | ICD-10-CM | POA: Diagnosis not present

## 2015-03-23 DIAGNOSIS — I10 Essential (primary) hypertension: Secondary | ICD-10-CM | POA: Diagnosis not present

## 2015-03-23 DIAGNOSIS — E139 Other specified diabetes mellitus without complications: Secondary | ICD-10-CM | POA: Diagnosis not present

## 2015-06-24 DIAGNOSIS — E784 Other hyperlipidemia: Secondary | ICD-10-CM | POA: Diagnosis not present

## 2015-06-24 DIAGNOSIS — R7301 Impaired fasting glucose: Secondary | ICD-10-CM | POA: Diagnosis not present

## 2015-06-24 DIAGNOSIS — I10 Essential (primary) hypertension: Secondary | ICD-10-CM | POA: Diagnosis not present

## 2015-06-24 DIAGNOSIS — K219 Gastro-esophageal reflux disease without esophagitis: Secondary | ICD-10-CM | POA: Diagnosis not present

## 2015-06-24 DIAGNOSIS — M79609 Pain in unspecified limb: Secondary | ICD-10-CM | POA: Diagnosis not present

## 2015-07-25 ENCOUNTER — Other Ambulatory Visit: Payer: Self-pay

## 2015-07-25 DIAGNOSIS — Z1231 Encounter for screening mammogram for malignant neoplasm of breast: Secondary | ICD-10-CM

## 2015-07-29 ENCOUNTER — Ambulatory Visit
Admission: RE | Admit: 2015-07-29 | Discharge: 2015-07-29 | Disposition: A | Discharge status: Home / Self Care | Payer: Medicaid Other | Source: Ambulatory Visit

## 2015-07-29 DIAGNOSIS — Z1231 Encounter for screening mammogram for malignant neoplasm of breast: Secondary | ICD-10-CM | POA: Diagnosis not present

## 2015-09-21 DIAGNOSIS — I10 Essential (primary) hypertension: Secondary | ICD-10-CM | POA: Diagnosis not present

## 2015-09-21 DIAGNOSIS — E784 Other hyperlipidemia: Secondary | ICD-10-CM | POA: Diagnosis not present

## 2015-09-21 DIAGNOSIS — I872 Venous insufficiency (chronic) (peripheral): Secondary | ICD-10-CM | POA: Diagnosis not present

## 2015-09-21 DIAGNOSIS — Z1389 Encounter for screening for other disorder: Secondary | ICD-10-CM | POA: Diagnosis not present

## 2015-09-21 DIAGNOSIS — E139 Other specified diabetes mellitus without complications: Secondary | ICD-10-CM | POA: Diagnosis not present

## 2015-09-30 ENCOUNTER — Encounter: Payer: Self-pay | Admitting: Certified Nurse Midwife

## 2015-09-30 ENCOUNTER — Ambulatory Visit (INDEPENDENT_AMBULATORY_CARE_PROVIDER_SITE_OTHER): Payer: Medicare Other | Admitting: Certified Nurse Midwife

## 2015-09-30 VITALS — BP 148/90 | HR 87 | Temp 98.3°F | Ht 66.0 in | Wt 297.0 lb

## 2015-09-30 DIAGNOSIS — N951 Menopausal and female climacteric states: Secondary | ICD-10-CM

## 2015-09-30 DIAGNOSIS — N939 Abnormal uterine and vaginal bleeding, unspecified: Secondary | ICD-10-CM | POA: Diagnosis not present

## 2015-09-30 DIAGNOSIS — N924 Excessive bleeding in the premenopausal period: Secondary | ICD-10-CM | POA: Insufficient documentation

## 2015-09-30 DIAGNOSIS — Z01419 Encounter for gynecological examination (general) (routine) without abnormal findings: Secondary | ICD-10-CM | POA: Diagnosis not present

## 2015-09-30 LAB — CBC WITH DIFFERENTIAL/PLATELET
BASOS ABS: 0 10*3/uL (ref 0.0–0.1)
BASOS PCT: 0 % (ref 0–1)
Eosinophils Absolute: 0.4 10*3/uL (ref 0.0–0.7)
Eosinophils Relative: 4 % (ref 0–5)
HCT: 39 % (ref 36.0–46.0)
HEMOGLOBIN: 13.5 g/dL (ref 12.0–15.0)
Lymphocytes Relative: 43 % (ref 12–46)
Lymphs Abs: 4 10*3/uL (ref 0.7–4.0)
MCH: 31.1 pg (ref 26.0–34.0)
MCHC: 34.6 g/dL (ref 30.0–36.0)
MCV: 89.9 fL (ref 78.0–100.0)
MONO ABS: 0.8 10*3/uL (ref 0.1–1.0)
MPV: 10.3 fL (ref 8.6–12.4)
Monocytes Relative: 8 % (ref 3–12)
NEUTROS ABS: 4.2 10*3/uL (ref 1.7–7.7)
Neutrophils Relative %: 45 % (ref 43–77)
PLATELETS: 357 10*3/uL (ref 150–400)
RBC: 4.34 MIL/uL (ref 3.87–5.11)
RDW: 14.3 % (ref 11.5–15.5)
WBC: 9.4 10*3/uL (ref 4.0–10.5)

## 2015-09-30 LAB — COMPREHENSIVE METABOLIC PANEL
ALBUMIN: 4.1 g/dL (ref 3.6–5.1)
ALT: 14 U/L (ref 6–29)
AST: 17 U/L (ref 10–35)
Alkaline Phosphatase: 85 U/L (ref 33–130)
BILIRUBIN TOTAL: 0.3 mg/dL (ref 0.2–1.2)
BUN: 13 mg/dL (ref 7–25)
CHLORIDE: 107 mmol/L (ref 98–110)
CO2: 27 mmol/L (ref 20–31)
CREATININE: 1.05 mg/dL — AB (ref 0.50–0.99)
Calcium: 9.3 mg/dL (ref 8.6–10.4)
Glucose, Bld: 88 mg/dL (ref 65–99)
Potassium: 4.4 mmol/L (ref 3.5–5.3)
SODIUM: 140 mmol/L (ref 135–146)
TOTAL PROTEIN: 7.8 g/dL (ref 6.1–8.1)

## 2015-09-30 NOTE — Progress Notes (Signed)
Patient ID: Mikayla ChristenLucille M Solarz, female   DOB: Oct 30, 1955, 59 y.o.   MRN: [id number]    Subjective:        Mikayla Moyer is a 59 y.o. female here for a routine exam.  Current complaints: none, is still having periods.  States that she started having periods late in life >59 years of age.  States that a few years ago had irregular bleeding.  Has not had a year of no period.  Around May had 2 + periods for several months with heavy bleeding.  Is having a regular monthly cycle now with bleeding lasting 7 days, denies any clots or menorrhagia.      Personal health questionnaire:  Is patient Ashkenazi Jewish, have a family history of breast and/or ovarian cancer: Yes, sister deceased from [facility] Is there a family history of uterine cancer diagnosed at age < 8950, gastrointestinal cancer, urinary tract cancer, family member who is a Personnel officerLynch syndrome-associated carrier: no Is the patient overweight and hypertensive, family history of diabetes, personal history of gestational diabetes, preeclampsia or PCOS: yes Is patient over 6055, have PCOS,  family history of premature CHD under age 61, diabetes, smoke, have hypertension or peripheral artery disease:  yes At any time, has a partner hit, kicked or otherwise hurt or frightened you?: no Over the past 2 weeks, have you felt down, depressed or hopeless?: no Over the past 2 weeks, have you felt little interest or pleasure in doing things?:no   Gynecologic History Patient's last menstrual period was 09/28/2015. Contraception: abstinence Last Pap: 08/06/13. Results were: normal Last mammogram: 07/29/15. Results were: normal  Obstetric History OB History  No data available    Past Medical History  Diagnosis Date  . Hypertension   . Hyperlipidemia   . Osteoporosis     Past Surgical History  Procedure Laterality Date  . Knee surgery    . Tonsillectomy    . Appendectomy    . Nasal sinus surgery       Current outpatient prescriptions:  .  amLODipine  (NORVASC) 10 MG tablet, Take 10 mg by mouth daily., Disp: , Rfl:  .  amLODipine (NORVASC) 5 MG tablet, Take 10 mg by mouth daily. , Disp: , Rfl:  .  fish oil-omega-3 fatty acids 1000 MG capsule, Take 1 g by mouth 2 (two) times daily., Disp: , Rfl:  .  fluticasone (FLONASE) 50 MCG/ACT nasal spray, Place 2 sprays into the nose daily., Disp: , Rfl:  .  fluticasone (VERAMYST) 27.5 MCG/SPRAY nasal spray, Place 2 sprays into the nose daily as needed for rhinitis., Disp: , Rfl:  .  losartan-hydrochlorothiazide (HYZAAR) 100-25 MG per tablet, Take 1 tablet by mouth daily., Disp: , Rfl:  .  mometasone (ELOCON) 0.1 % cream, Apply 1 application topically as needed (finger rash)., Disp: , Rfl:  .  omeprazole (PRILOSEC) 20 MG capsule, Take 20 mg by mouth daily., Disp: , Rfl:  .  pravastatin (PRAVACHOL) 40 MG tablet, Take 40 mg by mouth at bedtime., Disp: , Rfl:  .  Vitamin D, Ergocalciferol, (DRISDOL) 50000 UNITS CAPS, Take 50,000 Units by mouth every 7 (seven) days. On Monday, Disp: , Rfl:  No Known Allergies  Social History  Substance Use Topics  . Smoking status: Never Smoker   . Smokeless tobacco: Not on file  . Alcohol Use: No    History reviewed. No pertinent family history.    Review of Systems  Constitutional: negative for fatigue and weight loss Respiratory: negative for cough  and wheezing Cardiovascular: negative for chest pain, fatigue and palpitations Gastrointestinal: negative for abdominal pain and change in bowel habits Musculoskeletal:negative for myalgias Neurological: negative for gait problems and tremors Behavioral/Psych: negative for abusive relationship, depression Endocrine: negative for temperature intolerance   Genitourinary:negative for abnormal menstrual periods, genital lesions, hot flashes, sexual problems and vaginal discharge Integument/breast: negative for breast lump, breast tenderness, nipple discharge and skin lesion(s)    Objective:       BP 148/90 mmHg   Pulse 87  Temp(Src) 98.3 F (36.8 C)  Ht  (1.676 m)  Wt 297 lb (134.718 kg)  BMI 47.96 kg/m2  LMP 09/28/2015 General:   alert  Skin:   no rash or abnormalities  Lungs:   clear to auscultation bilaterally  Heart:   regular rate and rhythm, S1, S2 normal, no murmur, click, rub or gallop  Breasts:   normal without suspicious masses, skin or nipple changes or axillary nodes  Abdomen:  normal findings: no organomegaly, soft, non-tender and no hernia  Pelvis:  External genitalia: normal general appearance Urinary system: urethral meatus normal and bladder without fullness, nontender Vaginal: normal without tenderness, induration or masses Cervix: normal appearance Adnexa: normal bimanual exam Uterus: anteverted and non-tender, normal size, difficult to assess d/t body habitus   Lab Review Urine pregnancy test Labs reviewed yes Radiologic studies reviewed yes: 2014 pelvic US normal  50% of 30 min visit spent on counseling and coordination of care.   Assessment:    Healthy female exam.   AUB  Perimenopausal status  Plan:    Education reviewed: calcium supplements, depression evaluation, low fat, low cholesterol diet, self breast exams, skin cancer screening and weight bearing exercise. Contraception: abstinence. Follow up in: 1 year. Up to date on mammogram   No orders of the defined types were placed in this encounter.   Orders Placed This Encounter  Procedures  . FSH/LH  . CBC with Differential/Platelet  . Comprehensive metabolic panel  . TSH   Possible management options include: repeat of pelvic US if bleeding continues

## 2015-10-01 LAB — TSH: TSH: 0.864 u[IU]/mL (ref 0.350–4.500)

## 2015-10-01 LAB — FSH/LH
FSH: 17.6 m[IU]/mL
LH: 13.5 m[IU]/mL

## 2015-10-04 LAB — PAP IG (IMAGE GUIDED)

## 2015-10-13 ENCOUNTER — Ambulatory Visit: Payer: Medicare Other | Admitting: Certified Nurse Midwife

## 2015-12-17 ENCOUNTER — Emergency Department (HOSPITAL_COMMUNITY): Payer: Medicare Other

## 2015-12-17 ENCOUNTER — Encounter (HOSPITAL_COMMUNITY): Payer: Self-pay | Admitting: Emergency Medicine

## 2015-12-17 ENCOUNTER — Emergency Department (HOSPITAL_COMMUNITY)
Admission: EM | Admit: 2015-12-17 | Discharge: 2015-12-17 | Disposition: A | Discharge status: Home / Self Care | Payer: Medicare Other | Attending: Emergency Medicine | Admitting: Emergency Medicine

## 2015-12-17 DIAGNOSIS — Y9289 Other specified places as the place of occurrence of the external cause: Secondary | ICD-10-CM | POA: Diagnosis not present

## 2015-12-17 DIAGNOSIS — I10 Essential (primary) hypertension: Secondary | ICD-10-CM | POA: Insufficient documentation

## 2015-12-17 DIAGNOSIS — Y998 Other external cause status: Secondary | ICD-10-CM | POA: Insufficient documentation

## 2015-12-17 DIAGNOSIS — E785 Hyperlipidemia, unspecified: Secondary | ICD-10-CM | POA: Insufficient documentation

## 2015-12-17 DIAGNOSIS — S79921A Unspecified injury of right thigh, initial encounter: Secondary | ICD-10-CM | POA: Diagnosis not present

## 2015-12-17 DIAGNOSIS — S300XXA Contusion of lower back and pelvis, initial encounter: Secondary | ICD-10-CM | POA: Diagnosis not present

## 2015-12-17 DIAGNOSIS — W1839XA Other fall on same level, initial encounter: Secondary | ICD-10-CM | POA: Diagnosis not present

## 2015-12-17 DIAGNOSIS — Z79899 Other long term (current) drug therapy: Secondary | ICD-10-CM | POA: Diagnosis not present

## 2015-12-17 DIAGNOSIS — M47816 Spondylosis without myelopathy or radiculopathy, lumbar region: Secondary | ICD-10-CM | POA: Diagnosis not present

## 2015-12-17 DIAGNOSIS — Y9389 Activity, other specified: Secondary | ICD-10-CM | POA: Diagnosis not present

## 2015-12-17 DIAGNOSIS — S3993XA Unspecified injury of pelvis, initial encounter: Secondary | ICD-10-CM | POA: Diagnosis present

## 2015-12-17 MED ORDER — OXYCODONE-ACETAMINOPHEN 5-325 MG PO TABS: 1.0000 | ORAL_TABLET | ORAL | Status: DC | PRN

## 2015-12-17 MED ORDER — ONDANSETRON 8 MG PO TBDP
8.0000 mg | ORAL_TABLET | Freq: Once | ORAL | Status: AC
  Administered 2015-12-17: 8 mg via ORAL
  Filled 2015-12-17: qty 1

## 2015-12-17 MED ORDER — OXYCODONE-ACETAMINOPHEN 5-325 MG PO TABS
2.0000 | ORAL_TABLET | Freq: Once | ORAL | Status: AC
  Administered 2015-12-17: 2 via ORAL
  Filled 2015-12-17: qty 2

## 2015-12-17 NOTE — ED Notes (Signed)
Last night fell forward onto knees and hands. Today having low back pain that radiates down her right leg. Able to walk with walker. Pt states "when I try to sit it feels like a little knot in my butt right here (pointing to upper posterior thigh)" States it hurts too much to sit down, standing up in triage, and says her right knee is swollen.

## 2015-12-17 NOTE — ED Provider Notes (Signed)
CSN: [id number]     Arrival date & time 12/17/15  0865 History   First MD Initiated Contact with Patient 12/17/15 [phone]     Chief Complaint  Patient presents with  . Fall  . Back Pain  . Knee Pain     (Consider location/radiation/quality/duration/timing/severity/associated sxs/prior Treatment) The history is provided by the patient.     Mikayla Moyer is a 60 y.o. female who describes injury from fall, yesterday when she was standing. She describes falling forward onto her knees and hands. She was able to get up and walk afterwards but has increased pain which requires her to use her rolling walker, which she had, from her knee surgery in 2010. She denies preceding symptoms, fever, chills, nausea, vomiting, cough, shortness of breath or chest pain. There are no other known modifying factors.   Past Medical History  Diagnosis Date  . Hypertension   . Hyperlipidemia   . Osteoporosis    Past Surgical History  Procedure Laterality Date  . Knee surgery    . Tonsillectomy    . Appendectomy    . Nasal sinus surgery     History reviewed. No pertinent family history. Social History  Substance Use Topics  . Smoking status: Never Smoker   . Smokeless tobacco: None  . Alcohol Use: No   OB History    No data available     Review of Systems  All other systems reviewed and are negative.     Allergies  Review of patient's allergies indicates no known allergies.  Home Medications   Prior to Admission medications   Medication Sig Start Date End Date Taking? Authorizing Provider  amLODipine (NORVASC) 10 MG tablet Take 10 mg by mouth daily.   Yes Historical Provider, MD  cetirizine (ZYRTEC) 10 MG tablet Take 10 mg by mouth daily as needed. allergies 11/30/15  Yes Historical Provider, MD  diclofenac (VOLTAREN) 75 MG EC tablet Take 75 mg by mouth 2 (two) times daily as needed for moderate pain.   Yes Historical Provider, MD  fish oil-omega-3 fatty acids 1000 MG capsule Take 1 g by  mouth daily.    Yes Historical Provider, MD  fluticasone (FLONASE) 50 MCG/ACT nasal spray Place 2 sprays into the nose daily as needed for allergies.    Yes Historical Provider, MD  fluticasone (VERAMYST) 27.5 MCG/SPRAY nasal spray Place 2 sprays into the nose daily as needed for rhinitis.   Yes Historical Provider, MD  losartan-hydrochlorothiazide (HYZAAR) 100-25 MG per tablet Take 1 tablet by mouth daily.   Yes Historical Provider, MD  meloxicam (MOBIC) 15 MG tablet Take 15 mg by mouth daily as needed for pain.   Yes Historical Provider, MD  mometasone (ELOCON) 0.1 % cream Apply 1 application topically 2 (two) times daily as needed (finger rash).    Yes Historical Provider, MD  nebivolol (BYSTOLIC) 5 MG tablet Take 5 mg by mouth daily.   Yes Historical Provider, MD  omeprazole (PRILOSEC) 20 MG capsule Take 20 mg by mouth daily as needed (heartburn/ acid reflux).    Yes Historical Provider, MD  polyethylene glycol powder (GLYCOLAX/MIRALAX) powder Take 17 g by mouth daily as needed. constipation 12/08/15  Yes Historical Provider, MD  pravastatin (PRAVACHOL) 40 MG tablet Take 40 mg by mouth at bedtime.   Yes Historical Provider, MD  Vitamin D, Ergocalciferol, (DRISDOL) 50000 UNITS CAPS Take 50,000 Units by mouth every 7 (seven) days. On Monday   Yes Historical Provider, MD  oxyCODONE-acetaminophen (PERCOCET) 5-325 MG  tablet Take 1 tablet by mouth every 4 (four) hours as needed for severe pain. 12/17/15   Mancel Bale, MD   BP 107/57 mmHg  Pulse 68  Temp(Src) 97.9 F (36.6 C) (Oral)  Resp 14  SpO2 94% Physical Exam  Constitutional: She is oriented to person, place, and time. She appears well-developed.  Morbid obesity  HENT:  Head: Normocephalic and atraumatic.  Right Ear: External ear normal.  Left Ear: External ear normal.  Eyes: Conjunctivae and EOM are normal. Pupils are equal, round, and reactive to light.  Neck: Normal range of motion and phonation normal. Neck supple.  Cardiovascular:  Normal rate, regular rhythm and normal heart sounds.   Pulmonary/Chest: Effort normal and breath sounds normal. She exhibits no bony tenderness.  Abdominal: Soft. There is no tenderness.  Musculoskeletal: Normal range of motion.  Normal range of motion legs bilaterally. Tender right posterior thigh without deformity. Tender buttocks without deformity. Tender right lower back without deformity. Tenderness of the spinous processes of the cervical, thoracic or lumbar spine.  Neurological: She is alert and oriented to person, place, and time. No cranial nerve deficit or sensory deficit. She exhibits normal muscle tone. Coordination normal.  Skin: Skin is warm, dry and intact.  Psychiatric: She has a normal mood and affect. Her behavior is normal. Judgment and thought content normal.  Nursing note and vitals reviewed.   ED Course  Procedures (including critical care time)  Medications  ondansetron (ZOFRAN-ODT) disintegrating tablet 8 mg (8 mg Oral Given 12/17/15 1022)  oxyCODONE-acetaminophen (PERCOCET/ROXICET) 5-325 MG per tablet 2 tablet (2 tablets Oral Given 12/17/15 1022)    Patient Vitals for the past 24 hrs:  BP Temp Temp src Pulse Resp SpO2  12/17/15 1210 107/57 mmHg - - 68 14 94 %  12/17/15 0934 (!) 140/108 mmHg 97.9 F (36.6 C) Oral 86 16 99 %    12:47 PM Reevaluation with update and discussion. After initial assessment and treatment, an updated evaluation reveals she feels better after Percocet. She describes numbness in her left hand is ongoing for 1 week, it has improved with wearing a splint, and is recurrent from several years ago. She's been told that she had carpal tunnel problems. Findings discussed with the patient, all questions were answered. [name]    Labs Review Labs Reviewed - No data to display  Imaging Review Dg Lumbar Spine Complete  12/17/2015  CLINICAL DATA:  Acute low back pain radiating into the hips. Recent fall yesterday. EXAM: LUMBAR SPINE -  COMPLETE 4+ VIEW COMPARISON:  10/13/2012 FINDINGS: Normal alignment without acute compression fracture, wedge-shaped deformity or focal kyphosis. Stable minor disc space narrowing and endplate bony spurring at L3-4, L4-5, and L5-S1. No pars defects. Normal pedicles and SI joints for age. Normal bowel gas pattern. IMPRESSION: Stable minor degenerative changes. No interval change or acute osseous finding. Electronically Signed   By: Judie Petit.  Shick M.D.   On: 12/17/2015 10:25   Dg Pelvis 1-2 Views  12/17/2015  CLINICAL DATA:  Acute low back pain radiating into both hips. Recent fall yesterday. EXAM: PELVIS - 1-2 VIEW COMPARISON:  10/13/2012 FINDINGS: Minor endplate degenerative changes of the lower lumbar spine. Slight sclerosis of the right SI joint without fusion. Bony pelvis and hips appear symmetric and intact. No malalignment, diastases, fracture, subluxation, or dislocation. Normal bowel gas pattern. No abnormal calcifications. IMPRESSION: No acute osseous finding. Electronically Signed   By: Judie Petit.  Shick M.D.   On: 12/17/2015 10:32   I have personally reviewed  and evaluated these images and lab results as part of my medical decision-making.   EKG Interpretation None      MDM   Final diagnoses:  Contusion of pelvis, initial encounter  Spondylosis of lumbar region without myelopathy or radiculopathy    Contusion without fracture, radiculopathy or myelopathy.    Nursing Notes Reviewed/ Care Coordinated Applicable Imaging Reviewed Interpretation of Laboratory Data incorporated into ED treatment  The patient appears reasonably screened and/or stabilized for discharge and I doubt any other medical condition or other [facility] requiring further screening, evaluation, or treatment in the ED at this time prior to discharge.  Plan: Home Medications- Percocet; Home Treatments- rest; return here if the recommended treatment, does not improve the symptoms; Recommended follow up- PCP prn   Mancel Bale,  MD 12/17/15 1710

## 2016-09-06 ENCOUNTER — Other Ambulatory Visit: Payer: Self-pay | Admitting: Internal Medicine

## 2016-09-06 DIAGNOSIS — E2839 Other primary ovarian failure: Secondary | ICD-10-CM

## 2016-09-27 ENCOUNTER — Ambulatory Visit
Admission: RE | Admit: 2016-09-27 | Discharge: 2016-09-27 | Disposition: A | Discharge status: Home / Self Care | Payer: Medicare Other | Source: Ambulatory Visit | Attending: Internal Medicine | Admitting: Internal Medicine

## 2016-09-27 DIAGNOSIS — E2839 Other primary ovarian failure: Secondary | ICD-10-CM

## 2017-02-15 ENCOUNTER — Other Ambulatory Visit: Payer: Self-pay | Admitting: Internal Medicine

## 2017-02-15 DIAGNOSIS — Z1231 Encounter for screening mammogram for malignant neoplasm of breast: Secondary | ICD-10-CM

## 2017-03-06 ENCOUNTER — Ambulatory Visit
Admission: RE | Admit: 2017-03-06 | Discharge: 2017-03-06 | Disposition: A | Discharge status: Home / Self Care | Payer: Medicare Other | Source: Ambulatory Visit | Attending: Internal Medicine | Admitting: Internal Medicine

## 2017-03-06 DIAGNOSIS — Z1231 Encounter for screening mammogram for malignant neoplasm of breast: Secondary | ICD-10-CM

## 2017-06-25 ENCOUNTER — Ambulatory Visit (INDEPENDENT_AMBULATORY_CARE_PROVIDER_SITE_OTHER): Payer: Medicare Other | Admitting: Orthopaedic Surgery

## 2017-06-25 ENCOUNTER — Encounter (INDEPENDENT_AMBULATORY_CARE_PROVIDER_SITE_OTHER): Payer: Self-pay | Admitting: Orthopaedic Surgery

## 2017-06-25 ENCOUNTER — Ambulatory Visit (INDEPENDENT_AMBULATORY_CARE_PROVIDER_SITE_OTHER): Payer: Medicare Other

## 2017-06-25 DIAGNOSIS — M25562 Pain in left knee: Secondary | ICD-10-CM | POA: Diagnosis not present

## 2017-06-25 DIAGNOSIS — M25561 Pain in right knee: Secondary | ICD-10-CM

## 2017-06-25 DIAGNOSIS — M5442 Lumbago with sciatica, left side: Secondary | ICD-10-CM

## 2017-06-25 NOTE — Progress Notes (Signed)
Office Visit Note   Patient: Mikayla Moyer           Date of Birth: 03-07-1956           MRN: [id number] Visit Date: 06/25/2017              Requested by: Fleet Contras, MD [address] [place], [place] [address] PCP: Fleet Contras, MD   Assessment & Plan: Visit Diagnoses:  1. Acute pain of both knees   2. Acute left-sided low back pain with left-sided sciatica     Plan: Is good therapy for quad strengthening a recheck her again in 2 months.  Follow-Up Instructions: No Follow-up on file.   Orders:  Orders Placed This Encounter  Procedures  . XR Knee 1-2 Views Right  . XR KNEE 3 VIEW LEFT   No orders of the defined types were placed in this encounter.     Procedures: No procedures performed   Clinical Data: No additional findings.   Subjective: Chief Complaint  Patient presents with  . Left Knee - Pain  . Right Knee - Pain  . Lower Back - Pain    HPI patient is referred  by Dr.  Concepcion Elk for rub problems with her left knee. She had previous patellar tendon repair on the right knee by Dr. Ophelia Charter at around 2010. She's not worked in the several years she has difficulty getting from sitting to standing position. She leans forward chest and knees and then leans forward the once she gets her knees lock and then extends at the waist and touch is upright exam toward the bilateral weak quad gait. She has 20 extension lags on each knee with the quad weakness. She has difficulty getting up stairs to avoid going downstairs she states she understands she is at risk for falling and S/P care for she walks.  Review of Systems patient has a past history of some back pain problems patellar tendon rupture and repair on the right knee 2010   Objective: Vital Signs: There were no vitals taken for this visit.  Physical Exam  Constitutional: She is oriented to person, place, and time. She appears well-developed.  HENT:  Head: Normocephalic.  Right Ear: External ear normal.    Left Ear: External ear normal.  Eyes: Pupils are equal, round, and reactive to light.  Neck: No tracheal deviation present. No thyromegaly present.  Cardiovascular: Normal rate.   Pulmonary/Chest: Effort normal.  Abdominal: Soft.  Musculoskeletal:  Patient has normal hip range of motion. She has bilateral extensor leg weakness with 20-30 extension lag. Bilateral quad atrophy with weakness she does not have any abductor weakness no sensory disturbance L2 L3 L4 dermatome. Anterior tib EHL is strong there is some trace edema lower extremities. Negative Homan test.  Neurological: She is alert and oriented to person, place, and time.  Skin: Skin is warm and dry.  Psychiatric: She has a normal mood and affect. Her behavior is normal.    Ortho Exam  Specialty Comments:  No specialty comments available.  Imaging: No results found.   PMFS History: Patient Active Problem List   Diagnosis Date Noted  . Acute left-sided low back pain with left-sided sciatica 06/25/2017  . Acute pain of both knees 06/25/2017  . Abnormal perimenopausal bleeding 09/30/2015  . Routine gynecological examination 08/06/2013   Past Medical History:  Diagnosis Date  . Hyperlipidemia   . Hypertension   . Osteoporosis     No family history on file.  Past  Surgical History:  Procedure Laterality Date  . APPENDECTOMY    . KNEE SURGERY    . NASAL SINUS SURGERY    . TONSILLECTOMY     Social History   Occupational History  . Not on file.   Social History Main Topics  . Smoking status: Never Smoker  . Smokeless tobacco: Never Used  . Alcohol use No  . Drug use: No  . Sexual activity: Not Currently

## 2017-06-26 NOTE — Addendum Note (Signed)
Addended by: Rogers SeedsYEATTS, Jaleel Allen M on: 06/26/2017 12:01 PM   Modules accepted: Orders

## 2017-07-03 ENCOUNTER — Ambulatory Visit: Payer: Medicare Other | Attending: Orthopaedic Surgery | Admitting: Physical Therapy

## 2017-07-03 DIAGNOSIS — R2689 Other abnormalities of gait and mobility: Secondary | ICD-10-CM | POA: Insufficient documentation

## 2017-07-03 DIAGNOSIS — M25562 Pain in left knee: Secondary | ICD-10-CM | POA: Diagnosis present

## 2017-07-03 DIAGNOSIS — R296 Repeated falls: Secondary | ICD-10-CM | POA: Diagnosis present

## 2017-07-03 DIAGNOSIS — M25561 Pain in right knee: Secondary | ICD-10-CM | POA: Diagnosis present

## 2017-07-03 DIAGNOSIS — M5441 Lumbago with sciatica, right side: Secondary | ICD-10-CM | POA: Diagnosis present

## 2017-07-03 DIAGNOSIS — M5442 Lumbago with sciatica, left side: Secondary | ICD-10-CM | POA: Insufficient documentation

## 2017-07-03 DIAGNOSIS — R262 Difficulty in walking, not elsewhere classified: Secondary | ICD-10-CM | POA: Insufficient documentation

## 2017-07-03 NOTE — Therapy (Signed)
[facility] [address]  [address] [place], [place], [address] Phone: [phone]   Fax:  [phone]  Physical Therapy Evaluation  Patient Details  Name: Mikayla Moyer MRN: [id number] Date of Birth: 1956-08-23 Referring Provider: Annell Greening, MD  Encounter Date: 07/03/2017      PT End of Session - 07/03/17 1107    Visit Number 1   Number of Visits 16   Date for PT Re-Evaluation 08/30/17   Authorization Type UHC Medicare & Medicaid   PT Start Time 1016   PT Stop Time 1105   PT Time Calculation (min) 49 min   Activity Tolerance Patient tolerated treatment well   Behavior During Therapy [facility] for tasks assessed/performed      Past Medical History:  Diagnosis Date  . Hyperlipidemia   . Hypertension   . Osteoporosis     Past Surgical History:  Procedure Laterality Date  . APPENDECTOMY    . KNEE SURGERY    . NASAL SINUS SURGERY    . TONSILLECTOMY      There were no vitals filed for this visit.       Subjective Assessment - 07/03/17 1021    Subjective Pt reports frequent falls, typically falling backward. Most recent fall on 06/08/17 has got both of her knees as well as her back  hurting. Reports h/o R knee surgery (R patellar tendon repair) in 2010.   Pertinent History R patellar tendon repair in 2010 - limited rehab at the time due to only had Medicaid insurance coverage   Limitations Walking;Standing;Sitting;Other (comment);Lifting   How long can you sit comfortably? 5-6 minutes   How long can you stand comfortably? 5-6 minutes   How long can you walk comfortably? 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   Patient Stated Goals "Be able to walk w/o holding onto to things"   Currently in Pain? Yes   Pain Score 2    with walking   Pain Location Knee   Pain Orientation Right;Left   Pain Descriptors / Indicators Dull;Burning   Pain Type Acute pain;Chronic pain   Pain Radiating Towards n/a   Pain Onset 1 to 4 weeks ago   Pain Frequency Intermittent   Aggravating Factors  walking, sit <> stand transitions, pivot ot twisting on knees   Pain Relieving Factors elastic knee sleeve, OTC meds   Effect of Pain on Daily Activities limited walking tolerance & has to hold on to walls, difficulty with squatting/stooping   Multiple Pain Sites Yes   Pain Score 3   Pain Location Back   Pain Orientation Mid   Pain Descriptors / Indicators Burning   Pain Type Acute pain   Pain Radiating Towards into B buttocks & lateral hips   Pain Onset 1 to 4 weeks ago   Pain Frequency Intermittent   Aggravating Factors  moving around   Pain Relieving Factors OTC pain meds   Effect of Pain on Daily Activities unable to bend over; difficulty with sit <> stand transtions; limited standing tolerance - requires freq seated rest breasks            [facility] PT Assessment - 07/03/17 1016      Assessment   Medical Diagnosis Acute B knee pain & LBP   Referring Provider Annell Greening,  MD   Onset Date/Surgical Date 06/08/17   Next MD Visit 08/27/17   Prior Therapy PT after R knee surgery in 2010 (limited visits per [facility] insurance)     Balance Screen   Has the patient fallen in the past 6 months Yes   How many times? ~5   Has the patient had a decrease in activity level because of a fear of falling?  Yes   Is the patient reluctant to leave their home because of a fear of falling?  No     Home Environment   Living Environment Private residence   Living Arrangements Other relatives   Available Help at Discharge Other (Comment);Family  lives with sister who has to walk with walker   Type of Home House   Home Access Stairs to enter;Ramped entrance   Home Layout One level   Home Equipment [place] - single point;Shower seat      Prior Function   Level of Independence Independent   Vocation On disability   Leisure works at [employer] & [employer]; stopped going to gym ~3 months ago - worked with trainer on legs and back Financial pl[employer] program)     Observation/Other Assessments   Focus on Therapeutic Outcomes (FOTO)  Knee - 31% (69% limitation); predicted 47% (53% limitation)     ROM / Strength   AROM / PROM / Strength AROM;PROM;Strength     AROM   AROM Assessment Site Lumbar;Knee   Right/Left Knee Right;Left   Right Knee Extension 42   Right Knee Flexion 116   Left Knee Extension 37   Left Knee Flexion 132   Lumbar Flexion hands to mid shins - has to slide hands down legs and walk hands back up d/t pain   Lumbar Extension <20% - pain   Lumbar - Right Side Bend hand to lateral knee - pain   Lumbar - Left Side Bend hand to lateral knee - pain   Lumbar - Right Rotation 50% - pain   Lumbar - Left Rotation 25% - pain     PROM   PROM Assessment Site Knee   Right/Left Knee Right;Left   Right Knee Extension 0   Right Knee Flexion 130   Left Knee Extension 0   Left Knee Flexion 135     Strength   Strength Assessment Site Hip;Knee   Right/Left Hip Right;Left   Right Hip Flexion 4-/5   Right Hip Extension 3+/5   Right Hip External Rotation  3+/5   Right Hip Internal Rotation 3+/5   Right Hip ABduction 4-/5   Right Hip ADduction 3/5   Left Hip Flexion 4-/5   Left Hip Extension 3/5   Left Hip External Rotation 3/5   Left Hip Internal Rotation 3/5   Left Hip ABduction 4-/5   Left Hip ADduction 3-/5   Right/Left Knee Right;Left   Right Knee Flexion 3+/5   Right Knee Extension 2+/5   Left Knee Flexion 3+/5   Left Knee Extension 2+/5     Ambulation/Gait   Gait Pattern Step-through pattern;Wide base of support;Lateral trunk lean to right;Lateral trunk lean to left;Decreased hip/knee flexion - left;Decreased hip/knee flexion - right;Decreased arm swing - left;Decreased arm swing - right   Gait velocity 1.54  ft/sec   Gait Comments Pt demonstrating tendency for furniture ambulation reaching for walls or other surfaces and she walks through therapy clinic. Discussed possibility of use of cane, but pt resistant stating she has tried a cane in the past but it  makes her feel more likely to fall.     Standardized Balance Assessment   Standardized Balance Assessment 10 meter walk test   10 Meter Walk 21.31"            Objective measurements completed on examination: See above findings.                    PT Short Term Goals - 07/03/17 1105      PT SHORT TERM GOAL #1   Title Complete initial assessment for flexibility & balance   Status New   Target Date 07/12/17     PT SHORT TERM GOAL #2   Title Independent with initial HEP   Status New   Target Date 07/26/17     PT SHORT TERM GOAL #3   Title B quad strength >/= 3/5 to reduce quad lag/instability   Status New   Target Date 08/02/17     PT SHORT TERM GOAL #4   Title Improve gait speed to >/= 2.0 ft/sec with LRAD as indicated to decrease risk for recurrent falls   Status New   Target Date 08/02/17           PT Long Term Goals - 07/03/17 1105      PT LONG TERM GOAL #1   Title Independent with ongoing HEP +/- gym program    Status New   Target Date 08/30/17     PT LONG TERM GOAL #2   Title B knee AROM 0-130 for normal gait mechanics   Status New   Target Date 08/30/17     PT LONG TERM GOAL #3   Title B hip and knee strength 4-/5 to 4/5 or greater for improved gait stability   Status New   Target Date 08/30/17     PT LONG TERM GOAL #4   Title Gait speed >/= 2.62 ft/sec with or w/o LRAD for improved safety with community ambulation   Status New   Target Date 08/30/17                Plan - 07/03/17 1105    Clinical Impression Statement Santiana Glidden is a 61 y/o female who presents to OP PT for a moderately complex eval for acute bilateral knee pain and B low back with B sciatica. Pt reports  onset of pain following a fall on 06/08/17, but admits to frequent falls (5+) over the past 6 months with other near misses, most commonly falling backwards. Pt has h/o R patellar tendon repair in 2010 but had limited rehab following this due to Medicaid restrictions. Pt presents to PT with very unsteady gait, reaching for walls and furniture, with gait speed only 1.54 ft/sec indicating a high risk for recurrent falls. Pt with marked B LE weakness throughout hips and knees, esp in quads with >35 dg quad lag bilaterally due to quad weakness (0 dg PROM available). Lumbar ROM limited in all directions due to pain and tightness, with pt having support upper body with hands on legs for forward flexion and return to standing. Given extensity of deficits and time constraints of visit, further assessment of flexibility and balance will be completed at next visit with goals TBD as necessary. Based on current findings, anticipate pt will benefit from skilled PT to address strength and ROM deficits, along with balance and gait instability to reduce risk for further falls. POC may include manual therapy and modalities for pain management as indicated.   History and Personal Factors  relevant to plan of care: R patellar tendon repair in 2010 (limited rehab at the time due to only had Medicaid insurance coverage); frequent falls; osteoprosis   Clinical Presentation Unstable   Clinical Presentation due to: multiple problems (B knee pain, LBP & frequent falls) with pain escalating since latest fall   Clinical Decision Making High   Rehab Potential Good   Clinical Impairments Affecting Rehab Potential R patellar tendon repair in 2010 (limited rehab at the time due to only had Medicaid insurance coverage); frequent falls; osteoprosis   PT Frequency 2x / week   PT Duration 8 weeks   PT Treatment/Interventions Patient/family education;Therapeutic exercise;Neuromuscular re-education;Balance training;Functional mobility  training;Therapeutic activities;Gait training;Stair training;Manual techniques;Taping;Dry needling;Electrical Stimulation;Moist Heat;Traction;Cryotherapy;Vasopneumatic Device;Iontophoresis 4mg /ml Dexamethasone;ADLs/Self Care Home Management   PT Next Visit Plan complete initial assessment - LE flexibility & balance; initial HEP as indicated; modalities PRN for pain   Consulted and Agree with Plan of Care Patient      Patient will benefit from skilled therapeutic intervention in order to improve the following deficits and impairments:  Pain, Decreased strength, Decreased range of motion, Impaired flexibility, Abnormal gait, Difficulty walking, Decreased activity tolerance, Decreased balance  Visit Diagnosis: Acute pain of right knee  Acute pain of left knee  Acute bilateral low back pain with bilateral sciatica  Other abnormalities of gait and mobility  Difficulty in walking, not elsewhere classified  Repeated falls      G-Codes - 07/03/17 1105    Functional Assessment Tool Used (Outpatient Only) Knee FOTO = 31% (69% limitation)   Functional Limitation Mobility: Walking and moving around   Mobility: Walking and Moving Around Current Status ([id number]) At least 60 percent but less than 80 percent impaired, limited or restricted   Mobility: Walking and Moving Around Goal Status [id number]) At least 40 percent but less than 60 percent impaired, limited or restricted       Problem List Patient Active Problem List   Diagnosis Date Noted  . Acute left-sided low back pain with left-sided sciatica 06/25/2017  . Acute pain of both knees 06/25/2017  . Abnormal perimenopausal bleeding 09/30/2015  . Routine gynecological examination 08/06/2013    Marry GuanJoAnne M Tyrick Dunagan, PT, MPT 07/03/2017, 1:36 PM  [facility] [address]  [address] [place], [place], [address] Phone: [phone]   Fax:  [phone]  Name: Donna ChristenLucille M Borner MRN: [id number] Date  of Birth: 1955/12/27

## 2017-07-09 ENCOUNTER — Ambulatory Visit: Payer: Medicare Other | Admitting: Physical Therapy

## 2017-07-09 DIAGNOSIS — M25562 Pain in left knee: Secondary | ICD-10-CM

## 2017-07-09 DIAGNOSIS — M5442 Lumbago with sciatica, left side: Secondary | ICD-10-CM

## 2017-07-09 DIAGNOSIS — R296 Repeated falls: Secondary | ICD-10-CM

## 2017-07-09 DIAGNOSIS — R262 Difficulty in walking, not elsewhere classified: Secondary | ICD-10-CM

## 2017-07-09 DIAGNOSIS — M5441 Lumbago with sciatica, right side: Secondary | ICD-10-CM

## 2017-07-09 DIAGNOSIS — R2689 Other abnormalities of gait and mobility: Secondary | ICD-10-CM

## 2017-07-09 DIAGNOSIS — M25561 Pain in right knee: Secondary | ICD-10-CM | POA: Diagnosis not present

## 2017-07-09 NOTE — Therapy (Signed)
[facility] [address]  [address] [place], [place], [address] Phone: [phone]   Fax:  [phone]  Physical Therapy Treatment  Patient Details  Name: Mikayla Moyer MRN: [id number] Date of Birth: [birth year] Referring Provider: Annell Greening, MD  Encounter Date: 07/09/2017      PT End of Session - 07/09/17 1308    Visit Number 2   Number of Visits 16   Date for PT Re-Evaluation 08/30/17   Authorization Type UHC Medicare & Medicaid   PT Start Time 1308   PT Stop Time 1400   PT Time Calculation (min) 52 min   Activity Tolerance Patient tolerated treatment well   Behavior During Therapy [facility] for tasks assessed/performed      Past Medical History:  Diagnosis Date  . Hyperlipidemia   . Hypertension   . Osteoporosis     Past Surgical History:  Procedure Laterality Date  . APPENDECTOMY    . KNEE SURGERY    . NASAL SINUS SURGERY    . TONSILLECTOMY      There were no vitals filed for this visit.      Subjective Assessment - 07/09/17 1309    Subjective Pt reports back pain upon waking this morning, but as of arrival to PT, denies any pain.   Pertinent History R patellar tendon repair in 2010 - limited rehab at the time due to only had Medicaid insurance coverage   Patient Stated Goals "Be able to walk w/o holding onto to things"   Currently in Pain? No/denies            [facility] PT Assessment - 07/09/17 1308      Flexibility   Soft Tissue Assessment /Muscle Length yes   Hamstrings mod tight B   Quadriceps mild tight quad & hip flexors   ITB mod tight B   Piriformis mod tight B     Standardized Balance Assessment   Standardized Balance Assessment Berg Balance Test;Five Times Sit to Stand;Timed Up and Go Test;Dynamic Gait Index   Five times sit to stand comments  25.90"     Berg Balance Test   Sit to Stand Able to stand  independently using hands   Standing Unsupported Able to stand 2 minutes with  supervision   Sitting with Back Unsupported but Feet Supported on Floor or Stool Able to sit safely and securely 2 minutes   Stand to Sit Sits independently, has uncontrolled descent   Transfers Able to transfer safely, definite need of hands   Standing Unsupported with Eyes Closed Able to stand 10 seconds with supervision   Standing Ubsupported with Feet Together Able to place feet together independently and stand for 1 minute with supervision   From Standing, Reach Forward with Outstretched Arm Can reach forward >12 cm safely (5")   From Standing Position, Pick up Object from Floor Unable to pick up and needs supervision   From Standing Position, Turn to Look Behind Over each Shoulder Turn sideways only but maintains balance   Turn 360 Degrees Needs close supervision or verbal cueing   Standing Unsupported, Alternately Place Feet on Step/Stool Able to complete 4 steps without aid or supervision   Standing Unsupported, One Foot in Front Able to take small step independently and hold 30 seconds   Standing on One Leg Tries to lift leg/unable to hold 3 seconds but remains standing independently   Total Score 32   Berg comment: < 36 high risk for falls (  close to 100%)      Dynamic Gait Index   Level Surface Mild Impairment   Change in Gait Speed Severe Impairment   Gait with Horizontal Head Turns Moderate Impairment   Gait with Vertical Head Turns Moderate Impairment   Gait and Pivot Turn Moderate Impairment   Step Over Obstacle Moderate Impairment   Step Around Obstacles Moderate Impairment   Steps Severe Impairment   Total Score 7   DGI comment: Scores of 19 or less are predictive of falls in older community living adults     Timed Up and Go Test   Normal TUG (seconds) 24.72                     [facility] Adult PT Treatment/Exercise - 07/09/17 1308      Exercises   Exercises Knee/Hip     Lumbar Exercises: Supine   Ab Set 10 reps;5 seconds     Knee/Hip Exercises:  Stretches   Passive Hamstring Stretch Both;30 seconds;2 reps   Passive Hamstring Stretch Limitations supine with strap   ITB Stretch Both;30 seconds;2 reps   ITB Stretch Limitations supine with strap   Piriformis Stretch Both;30 seconds;2 reps   Piriformis Stretch Limitations KTOS     Knee/Hip Exercises: Aerobic   Nustep lvl 4 x 6' (B UE/LE)     Knee/Hip Exercises: Supine   Quad Sets Both;10 reps  5" hold    Quad Sets Limitations towel roll under knee                PT Education - 07/09/17 1400    Education provided Yes   Education Details Balance assessment findings & anticipated POC; initial HEP   Person(s) Educated Patient   Methods Explanation;Demonstration;Verbal cues;Tactile cues;Handout   Comprehension Verbalized understanding;Returned demonstration;Verbal cues required;Tactile cues required;Need further instruction          PT Short Term Goals - 07/09/17 1311      PT SHORT TERM GOAL #1   Title Complete initial assessment for flexibility & balance   Status On-going     PT SHORT TERM GOAL #2   Title Independent with initial HEP   Status On-going     PT SHORT TERM GOAL #3   Title B quad strength >/= 3/5 to reduce quad lag/instability   Status On-going     PT SHORT TERM GOAL #4   Title Improve gait speed to >/= 2.0 ft/sec with LRAD as indicated to decrease risk for recurrent falls   Status On-going           PT Long Term Goals - 07/09/17 1311      PT LONG TERM GOAL #1   Title Independent with ongoing HEP +/- gym program    Status On-going     PT LONG TERM GOAL #2   Title B knee AROM 0-130 for normal gait mechanics   Status On-going     PT LONG TERM GOAL #3   Title B hip and knee strength 4-/5 to 4/5 or greater for improved gait stability   Status On-going     PT LONG TERM GOAL #4   Title Gait speed >/= 2.62 ft/sec with or w/o LRAD for improved safety with community ambulation   Status On-going               Plan - 07/09/17  1312    Clinical Impression Statement Standardized balance testing completed with pt at high reisk for falls on all testing. Flexibility  assessment reveals mild to mod tightness in majority of proximal LE musculature, therefore inital HEP provided targeting flexibility as well as core stabilization and quad activation. Pt noting improved flexibility with less pain following initial stretches.    Rehab Potential Good   Clinical Impairments Affecting Rehab Potential R patellar tendon repair in 2010 (limited rehab at the time due to only had Medicaid insurance coverage); frequent falls; osteoprosis   PT Treatment/Interventions Patient/family education;Therapeutic exercise;Neuromuscular re-education;Balance training;Functional mobility training;Therapeutic activities;Gait training;Stair training;Manual techniques;Taping;Dry needling;Electrical Stimulation;Moist Heat;Traction;Cryotherapy;Vasopneumatic Device;Iontophoresis 4mg /ml Dexamethasone;ADLs/Self Care Home Management   PT Next Visit Plan LE flexibility; core stabilization & proximal LE strengthening - HEP update as indicated; manual therapy & modalities PRN for pain   Consulted and Agree with Plan of Care Patient      Patient will benefit from skilled therapeutic intervention in order to improve the following deficits and impairments:  Pain, Decreased strength, Decreased range of motion, Impaired flexibility, Abnormal gait, Difficulty walking, Decreased activity tolerance, Decreased balance  Visit Diagnosis: Acute pain of right knee  Acute pain of left knee  Acute bilateral low back pain with bilateral sciatica  Other abnormalities of gait and mobility  Difficulty in walking, not elsewhere classified  Repeated falls     Problem List Patient Active Problem List   Diagnosis Date Noted  . Acute left-sided low back pain with left-sided sciatica 06/25/2017  . Acute pain of both knees 06/25/2017  . Abnormal perimenopausal bleeding  09/30/2015  . Routine gynecological examination 08/06/2013    Marry GuanJoAnne M Cina Klumpp, PT, MPT 07/09/2017, 5:48 PM  [facility] [address]  [address] [place], [place], [address] Phone: [phone]   Fax:  [phone]  Name: Mikayla Moyer MRN: [id number] Date of Birth: [birth year]

## 2017-07-11 ENCOUNTER — Ambulatory Visit: Payer: Medicare Other | Admitting: Physical Therapy

## 2017-07-15 ENCOUNTER — Ambulatory Visit: Payer: Medicare Other

## 2017-07-18 ENCOUNTER — Ambulatory Visit: Payer: Medicare Other

## 2017-07-18 DIAGNOSIS — R296 Repeated falls: Secondary | ICD-10-CM

## 2017-07-18 DIAGNOSIS — M25561 Pain in right knee: Secondary | ICD-10-CM | POA: Diagnosis not present

## 2017-07-18 DIAGNOSIS — R262 Difficulty in walking, not elsewhere classified: Secondary | ICD-10-CM

## 2017-07-18 DIAGNOSIS — M5441 Lumbago with sciatica, right side: Secondary | ICD-10-CM

## 2017-07-18 DIAGNOSIS — M25562 Pain in left knee: Secondary | ICD-10-CM

## 2017-07-18 DIAGNOSIS — R2689 Other abnormalities of gait and mobility: Secondary | ICD-10-CM

## 2017-07-18 DIAGNOSIS — M5442 Lumbago with sciatica, left side: Secondary | ICD-10-CM

## 2017-07-18 NOTE — Therapy (Signed)
[facility] Outpatient Rehabilitation [facility] [address]  [address] [place], [place], [address] Phone: [phone]   Fax:  [phone]  Physical Therapy Treatment  Patient Details  Name: Mikayla Moyer MRN: [id number] Date of Birth: 07/11/1956 Referring Provider: Annell Greening, MD  Encounter Date: 07/18/2017      PT End of Session - 07/18/17 1317    Visit Number 3   Number of Visits 16   Date for PT Re-Evaluation 08/30/17   Authorization Type UHC Medicare & Medicaid   PT Start Time 1312   PT Stop Time 1355   PT Time Calculation (min) 43 min   Activity Tolerance Patient tolerated treatment well   Behavior During Therapy [facility] for tasks assessed/performed      Past Medical History:  Diagnosis Date  . Hyperlipidemia   . Hypertension   . Osteoporosis     Past Surgical History:  Procedure Laterality Date  . APPENDECTOMY    . KNEE SURGERY    . NASAL SINUS SURGERY    . TONSILLECTOMY      There were no vitals filed for this visit.      Subjective Assessment - 07/18/17 1314    Subjective Pt. reporting some pain this morning which is relieved when she lays down.  Had some pain while picking up objects off floor over last few days.     Patient Stated Goals "Be able to walk w/o holding onto to things"   Currently in Pain? No/denies   Pain Score 0-No pain   Multiple Pain Sites No                         [facility] Adult PT Treatment/Exercise - 07/18/17 1327      Lumbar Exercises: Supine   Ab Set 10 reps;5 seconds   AB Set Limitations verbal cueing for proper technique    Clam 10 reps;3 seconds   Clam Limitations alternating; red TB around knees   Bent Knee Raise 3 seconds;15 reps   Bent Knee Raise Limitations with cueing for abdominal brace      Knee/Hip Exercises: Stretches   Passive Hamstring Stretch Both;30 seconds;2 reps  cueing required to point toe as to avoid calf stretch   Passive Hamstring Stretch Limitations supine with  strap   ITB Stretch Both;30 seconds;2 reps   ITB Stretch Limitations supine with strap   Piriformis Stretch Both;30 seconds;2 reps   Piriformis Stretch Limitations KTOS     Knee/Hip Exercises: Aerobic   Nustep lvl 4 x 6' (B UE/LE)     Knee/Hip Exercises: Seated   Long Arc Quad Right;Left;10 reps   Long [employer] Limitations with adduction ball squeeze      Knee/Hip Exercises: Supine   Quad Sets Both;10 reps   [employer] Limitations towel under knee    Short Arc [employer];Right;Left;1 set;10 reps   Short Arc [employer] Limitations Therapist concentric assist; 3" eccentric lowering by pt. with some therapist assist   Bridges with Clamshell 10 reps;Both  sustained hip abd/ER with red TB at knees; limited ROM    Straight Leg Raises Left;Right;10 reps     Knee/Hip Exercises: Sidelying   Clams R clam shell with red TB x 10, L clam shell without resistance x 10                   PT Short Term Goals - 07/09/17 1311      PT SHORT  TERM GOAL #1   Title Complete initial assessment for flexibility & balance   Status On-going     PT SHORT TERM GOAL #2   Title Independent with initial HEP   Status On-going     PT SHORT TERM GOAL #3   Title B quad strength >/= 3/5 to reduce quad lag/instability   Status On-going     PT SHORT TERM GOAL #4   Title Improve gait speed to >/= 2.0 ft/sec with LRAD as indicated to decrease risk for recurrent falls   Status On-going           PT Long Term Goals - 07/09/17 1311      PT LONG TERM GOAL #1   Title Independent with ongoing HEP +/- gym program    Status On-going     PT LONG TERM GOAL #2   Title B knee AROM 0-130 for normal gait mechanics   Status On-going     PT LONG TERM GOAL #3   Title B hip and knee strength 4-/5 to 4/5 or greater for improved gait stability   Status On-going     PT LONG TERM GOAL #4   Title Gait speed >/= 2.62 ft/sec with or w/o LRAD for improved safety with community ambulation   Status On-going                Plan - 07/18/17 1324    Clinical Impression Statement [name] doing well today noting LBP bothering her over last few days when bending.  HEP review today with pt. requiring cueing to relax LE musculature during stretches.  Basic lumbpelvic strengthening activities initiated today without issue.  Pt. very weak with LE strengthening requiring consistent verbal cueing to encourage full effort with quad strengthening.  Notes she has feelings off knees "wanting to buckle on me".  Will benefit from further skilled therapy to improve LE/core strength to maximize functional ability and improve safety.     PT Treatment/Interventions Patient/family education;Therapeutic exercise;Neuromuscular re-education;Balance training;Functional mobility training;Therapeutic activities;Gait training;Stair training;Manual techniques;Taping;Dry needling;Electrical Stimulation;Moist Heat;Traction;Cryotherapy;Vasopneumatic Device;Iontophoresis /ml Dexamethasone;ADLs/Self Care Home Management   PT Next Visit Plan LE flexibility; core stabilization & proximal LE strengthening - HEP update as indicated; manual therapy & modalities PRN for pain      Patient will benefit from skilled therapeutic intervention in order to improve the following deficits and impairments:  Pain, Decreased strength, Decreased range of motion, Impaired flexibility, Abnormal gait, Difficulty walking, Decreased activity tolerance, Decreased balance  Visit Diagnosis: Acute pain of right knee  Acute pain of left knee  Acute bilateral low back pain with bilateral sciatica  Other abnormalities of gait and mobility  Difficulty in walking, not elsewhere classified  Repeated falls     Problem List Patient Active Problem List   Diagnosis Date Noted  . Acute left-sided low back pain with left-sided sciatica 06/25/2017  . Acute pain of both knees 06/25/2017  . Abnormal perimenopausal bleeding 09/30/2015  . Routine  gynecological examination 08/06/2013    Kermit Balo, PTA 07/18/17 6:49 PM  [facility] [address]  [address] [place], [place], [address] Phone: [phone]   Fax:  [phone]  Name: CHALEY CASTELLANOS MRN: [id number] Date of Birth: 1956-10-05

## 2017-07-22 ENCOUNTER — Ambulatory Visit: Payer: Medicare Other | Admitting: Physical Therapy

## 2017-07-22 DIAGNOSIS — M5441 Lumbago with sciatica, right side: Secondary | ICD-10-CM

## 2017-07-22 DIAGNOSIS — M25561 Pain in right knee: Secondary | ICD-10-CM | POA: Diagnosis not present

## 2017-07-22 DIAGNOSIS — R296 Repeated falls: Secondary | ICD-10-CM

## 2017-07-22 DIAGNOSIS — M5442 Lumbago with sciatica, left side: Secondary | ICD-10-CM

## 2017-07-22 DIAGNOSIS — R2689 Other abnormalities of gait and mobility: Secondary | ICD-10-CM

## 2017-07-22 DIAGNOSIS — M25562 Pain in left knee: Secondary | ICD-10-CM

## 2017-07-22 DIAGNOSIS — R262 Difficulty in walking, not elsewhere classified: Secondary | ICD-10-CM

## 2017-07-22 NOTE — Therapy (Signed)
[facility] Outpatient Rehabilitation [facility] [address]  [address] [place], [place], [address] Phone: [phone]   Fax:  [phone]  Physical Therapy Treatment  Patient Details  Name: Mikayla Moyer MRN: [id number] Date of Birth: Dec 15, 1955 Referring Provider: Annell Greening, MD  Encounter Date: 07/22/2017      PT End of Session - 07/22/17 1400    Visit Number 4   Number of Visits 16   Date for PT Re-Evaluation 08/30/17   Authorization Type UHC Medicare & Medicaid   PT Start Time 1400   PT Stop Time 1449   PT Time Calculation (min) 49 min   Activity Tolerance Patient tolerated treatment well   Behavior During Therapy [facility] for tasks assessed/performed      Past Medical History:  Diagnosis Date  . Hyperlipidemia   . Hypertension   . Osteoporosis     Past Surgical History:  Procedure Laterality Date  . APPENDECTOMY    . KNEE SURGERY    . NASAL SINUS SURGERY    . TONSILLECTOMY      There were no vitals filed for this visit.      Subjective Assessment - 07/22/17 1402    Subjective Pt reports her back is hurting more today, but she thinks it was because she had to do a lot of standing around yesterday.   Pertinent History R patellar tendon repair in 2010 - limited rehab at the time due to only had Medicaid insurance coverage   Patient Stated Goals "Be able to walk w/o holding onto to things"   Currently in Pain? Yes   Pain Score 4    Pain Location Knee   Pain Orientation Right;Left   Pain Descriptors / Indicators Dull   Pain Type Acute pain;Chronic pain   Pain Score 7   Pain Location Back   Pain Orientation Mid   Pain Descriptors / Indicators Burning   Pain Type Acute pain                         [facility] Adult PT Treatment/Exercise - 07/22/17 1400      Bed Mobility   Bed Mobility Rolling Left;Left Sidelying to Sit   Rolling Left 5: Supervision   Rolling Left Details (indicate cue type and reason) log rolling   Left  Sidelying to Sit 5: Supervision     Self-Care   Self-Care Posture   Posture Educated pt on proper posture and body mechanics for sleeping positions, basic mobility and dailt household tasks.     Lumbar Exercises: Stretches   Double Knee to Chest Stretch 10 seconds;3 reps   Double Knee to Chest Stretch Limitations heels on peanut     Lumbar Exercises: Supine   Clam 10 reps;3 seconds   Clam Limitations alt hip ABD/ER with red TB   Bent Knee Raise 10 reps;3 seconds   Bent Knee Raise Limitations brace march with red TB     Knee/Hip Exercises: Aerobic   Nustep lvl 4 x 6' (B UE/LE)     Knee/Hip Exercises: Seated   Long Arc Quad Right;Left;5 reps   Long [employer] Limitations discontinued due to poor ROM & excessive substitution     Knee/Hip Exercises: Supine   Short Arc [employer] Both;AAROM;15 reps   Short Arc Quad Sets Limitations con/ecc AAROM   Hip Adduction Isometric Both;15 reps;Strengthening   Hip Adduction Isometric Limitations 5" ball squeeze + abd brace   Bridges with Clamshell Both;10  reps  + isometric hip ABD with red TB   Knee Extension Both;10 reps   Knee Extension Limitations alt quad/glute set isometric into peanut ball   Knee Flexion Both;10 reps   Knee Flexion Limitations HS curl with heels on peanut ball                PT Education - 07/22/17 1449    Education provided Yes   Education Details Posture & body mechanics education   Person(s) Educated Patient   Methods Explanation;Demonstration;Handout   Comprehension Verbalized understanding;Need further instruction          PT Short Term Goals - 07/09/17 1311      PT SHORT TERM GOAL #1   Title Complete initial assessment for flexibility & balance   Status On-going     PT SHORT TERM GOAL #2   Title Independent with initial HEP   Status On-going     PT SHORT TERM GOAL #3   Title B quad strength >/= 3/5 to reduce quad lag/instability   Status On-going     PT SHORT TERM GOAL #4   Title  Improve gait speed to >/= 2.0 ft/sec with LRAD as indicated to decrease risk for recurrent falls   Status On-going           PT Long Term Goals - 07/09/17 1311      PT LONG TERM GOAL #1   Title Independent with ongoing HEP +/- gym program    Status On-going     PT LONG TERM GOAL #2   Title B knee AROM 0-130 for normal gait mechanics   Status On-going     PT LONG TERM GOAL #3   Title B hip and knee strength 4-/5 to 4/5 or greater for improved gait stability   Status On-going     PT LONG TERM GOAL #4   Title Gait speed >/= 2.62 ft/sec with or w/o LRAD for improved safety with community ambulation   Status On-going               Plan - 07/22/17 1405    Clinical Impression Statement Pt reporting no issues with initial HEP but having difficulty with LAQ + hip add ball squeeze added at last visit. Review of this exercises revealing poor ROM and excessive substitution, therefore deferred from HEP at present. Today's exercises focused on supine level core and LE exercises due to c/o increased LBP. Treatment concluded with education in posture and body mechanics to reduce low back strain.    Rehab Potential Good   Clinical Impairments Affecting Rehab Potential R patellar tendon repair in 2010 (limited rehab at the time due to only had Medicaid insurance coverage); frequent falls; osteoprosis   PT Treatment/Interventions Patient/family education;Therapeutic exercise;Neuromuscular re-education;Balance training;Functional mobility training;Therapeutic activities;Gait training;Stair training;Manual techniques;Taping;Dry needling;Electrical Stimulation;Moist Heat;Traction;Cryotherapy;Vasopneumatic Device;Iontophoresis /ml Dexamethasone;ADLs/Self Care Home Management   PT Next Visit Plan LE flexibility; core stabilization & proximal LE strengthening - HEP update as indicated; manual therapy & modalities PRN for pain   Consulted and Agree with Plan of Care Patient      Patient will  benefit from skilled therapeutic intervention in order to improve the following deficits and impairments:  Pain, Decreased strength, Decreased range of motion, Impaired flexibility, Abnormal gait, Difficulty walking, Decreased activity tolerance, Decreased balance  Visit Diagnosis: Acute pain of right knee  Acute pain of left knee  Acute bilateral low back pain with bilateral sciatica  Other abnormalities of gait and mobility  Difficulty in  walking, not elsewhere classified  Repeated falls     Problem List Patient Active Problem List   Diagnosis Date Noted  . Acute left-sided low back pain with left-sided sciatica 06/25/2017  . Acute pain of both knees 06/25/2017  . Abnormal perimenopausal bleeding 09/30/2015  . Routine gynecological examination 08/06/2013    Marry Guan, PT, MPT 07/22/2017, 4:29 PM  [facility] [address]  [address] [place], [place], [address] Phone: [phone]   Fax:  [phone]  Name: Mikayla Moyer MRN: [id number] Date of Birth: [birth year]

## 2017-07-22 NOTE — Patient Instructions (Signed)

## 2017-07-25 ENCOUNTER — Ambulatory Visit: Payer: Medicare Other | Admitting: Physical Therapy

## 2017-07-25 DIAGNOSIS — M5442 Lumbago with sciatica, left side: Secondary | ICD-10-CM

## 2017-07-25 DIAGNOSIS — M25562 Pain in left knee: Secondary | ICD-10-CM

## 2017-07-25 DIAGNOSIS — M25561 Pain in right knee: Secondary | ICD-10-CM | POA: Diagnosis not present

## 2017-07-25 DIAGNOSIS — R262 Difficulty in walking, not elsewhere classified: Secondary | ICD-10-CM

## 2017-07-25 DIAGNOSIS — R2689 Other abnormalities of gait and mobility: Secondary | ICD-10-CM

## 2017-07-25 DIAGNOSIS — M5441 Lumbago with sciatica, right side: Secondary | ICD-10-CM

## 2017-07-25 DIAGNOSIS — R296 Repeated falls: Secondary | ICD-10-CM

## 2017-07-25 NOTE — Therapy (Signed)
[facility] Outpatient Rehabilitation [facility] [address]  [address] [place], [place], [address] Phone: [phone]   Fax:  [phone]  Physical Therapy Treatment  Patient Details  Name: Mikayla Moyer MRN: [id number] Date of Birth: [birth year] Referring Provider: Annell Greening, MD  Encounter Date: 07/25/2017      PT End of Session - 07/25/17 1450    Visit Number 5   Number of Visits 16   Date for PT Re-Evaluation 08/30/17   Authorization Type UHC Medicare & Medicaid   PT Start Time 1450   PT Stop Time 1539   PT Time Calculation (min) 49 min   Activity Tolerance Patient tolerated treatment well   Behavior During Therapy [facility] for tasks assessed/performed      Past Medical History:  Diagnosis Date  . Hyperlipidemia   . Hypertension   . Osteoporosis     Past Surgical History:  Procedure Laterality Date  . APPENDECTOMY    . KNEE SURGERY    . NASAL SINUS SURGERY    . TONSILLECTOMY      There were no vitals filed for this visit.      Subjective Assessment - 07/25/17 1450    Subjective Pt reporting overall pain better today - none in knees and low back much less.   Pertinent History R patellar tendon repair in 2010 - limited rehab at the time due to only had Medicaid insurance coverage   Patient Stated Goals "Be able to walk w/o holding onto to things"   Currently in Pain? Yes   Pain Score 0-No pain   Pain Location Knee   Pain Orientation Right;Left   Pain Score 3   Pain Location Back   Pain Orientation Mid   Pain Descriptors / Indicators Aching   Pain Type Acute pain   Pain Radiating Towards none today   Pain Onset More than a month ago   Pain Frequency Intermittent                         [facility] Adult PT Treatment/Exercise - 07/25/17 1450      Lumbar Exercises: Stretches   Quadruped Mid Back Stretch 30 seconds;2 reps   Quadruped Mid Back Stretch Limitations 3 way seated prayer stretch on green Pball     Lumbar  Exercises: Supine   Ab Set 10 reps;3 seconds   Clam 15 reps;3 seconds   Clam Limitations alt hip ABD/ER with red TB   Bent Knee Raise 15 reps;3 seconds   Bent Knee Raise Limitations brace march with red TB     Knee/Hip Exercises: Aerobic   Nustep lvl 5 x 6' (LE only)     Knee/Hip Exercises: Standing   Terminal Knee Extension Right;Left;10 reps   Terminal Knee Extension Limitations 5" hold - ball on wall   Functional Squat 10 reps;5 seconds   Functional Squat Limitations counter minisquat - chair behind pt in case of knee buckle     Knee/Hip Exercises: Seated   Ball Squeeze 10X5"   Other Seated Knee/Hip Exercises R/L Fitter leg press (2 blue) x10 each   Hamstring Curl Right;Left;10 reps;Strengthening   Hamstring Limitations red TB     Knee/Hip Exercises: Supine   Bridges with Clamshell Both;10 reps  + isometric hip ABD with red TB                PT Education - 07/25/17 1540    Education provided Yes   Education Details  HEP addtions - refer to PT Instructions   Person(s) Educated Patient   Methods Explanation;Demonstration;Handout   Comprehension Verbalized understanding;Returned demonstration          PT Short Term Goals - 07/25/17 1541      PT SHORT TERM GOAL #1   Title Complete initial assessment for flexibility & balance   Status Achieved     PT SHORT TERM GOAL #2   Title Independent with initial HEP   Status Achieved     PT SHORT TERM GOAL #3   Title B quad strength >/= 3/5 to reduce quad lag/instability   Status On-going     PT SHORT TERM GOAL #4   Title Improve gait speed to >/= 2.0 ft/sec with LRAD as indicated to decrease risk for recurrent falls   Status On-going           PT Long Term Goals - 07/09/17 1311      PT LONG TERM GOAL #1   Title Independent with ongoing HEP +/- gym program    Status On-going     PT LONG TERM GOAL #2   Title B knee AROM 0-130 for normal gait mechanics   Status On-going     PT LONG TERM GOAL #3   Title  B hip and knee strength 4-/5 to 4/5 or greater for improved gait stability   Status On-going     PT LONG TERM GOAL #4   Title Gait speed >/= 2.62 ft/sec with or w/o LRAD for improved safety with community ambulation   Status On-going               Plan - 07/25/17 1457    Clinical Impression Statement Pt reporting improving pain overall - no knee pain bilaterally and back pain significantly reduced today with no radicular pain at present. HEP updated with progression on lumbopelvic stabilization and strengthening exercises as well as addition of closed chain quad strengthening. Pt requiring moderate cues with minisquat technique and will benefit from further review of this exercise, but otherwise no issues with HEP progression.   Rehab Potential Good   Clinical Impairments Affecting Rehab Potential R patellar tendon repair in 2010 (limited rehab at the time due to only had Medicaid insurance coverage); frequent falls; osteoprosis   PT Treatment/Interventions Patient/family education;Therapeutic exercise;Neuromuscular re-education;Balance training;Functional mobility training;Therapeutic activities;Gait training;Stair training;Manual techniques;Taping;Dry needling;Electrical Stimulation;Moist Heat;Traction;Cryotherapy;Vasopneumatic Device;Iontophoresis /ml Dexamethasone;ADLs/Self Care Home Management   PT Next Visit Plan LE flexibility; core stabilization & proximal LE strengthening - HEP update as indicated; manual therapy & modalities PRN for pain   Consulted and Agree with Plan of Care Patient      Patient will benefit from skilled therapeutic intervention in order to improve the following deficits and impairments:  Pain, Decreased strength, Decreased range of motion, Impaired flexibility, Abnormal gait, Difficulty walking, Decreased activity tolerance, Decreased balance  Visit Diagnosis: Acute pain of right knee  Acute pain of left knee  Acute bilateral low back pain with  bilateral sciatica  Other abnormalities of gait and mobility  Difficulty in walking, not elsewhere classified  Repeated falls     Problem List Patient Active Problem List   Diagnosis Date Noted  . Acute left-sided low back pain with left-sided sciatica 06/25/2017  . Acute pain of both knees 06/25/2017  . Abnormal perimenopausal bleeding 09/30/2015  . Routine gynecological examination 08/06/2013    Marry Guan, PT, MPT 07/25/2017, 3:47 PM  [facility] Outpatient Rehabilitation [facility] [address]  [address] [place], [place], [address] Phone: [phone]   Fax:  [phone]  Name: Mikayla Moyer MRN: [id number] Date of Birth: 01/14/1956

## 2017-07-29 ENCOUNTER — Ambulatory Visit: Payer: Medicare Other | Attending: Orthopaedic Surgery | Admitting: Physical Therapy

## 2017-07-29 DIAGNOSIS — M25561 Pain in right knee: Secondary | ICD-10-CM | POA: Insufficient documentation

## 2017-07-29 DIAGNOSIS — M25562 Pain in left knee: Secondary | ICD-10-CM | POA: Diagnosis present

## 2017-07-29 DIAGNOSIS — M5441 Lumbago with sciatica, right side: Secondary | ICD-10-CM | POA: Diagnosis present

## 2017-07-29 DIAGNOSIS — R2689 Other abnormalities of gait and mobility: Secondary | ICD-10-CM | POA: Diagnosis present

## 2017-07-29 DIAGNOSIS — R262 Difficulty in walking, not elsewhere classified: Secondary | ICD-10-CM | POA: Diagnosis present

## 2017-07-29 DIAGNOSIS — M5442 Lumbago with sciatica, left side: Secondary | ICD-10-CM | POA: Diagnosis present

## 2017-07-29 DIAGNOSIS — R296 Repeated falls: Secondary | ICD-10-CM | POA: Diagnosis present

## 2017-07-29 NOTE — Therapy (Signed)
[facility] [address]  [address] [place], [place], [address] Phone: [phone]   Fax:  [phone]  Physical Therapy Treatment  Patient Details  Name: Mikayla Moyer MRN: [id number] Date of Birth: 03-17-1956 Referring Provider: Annell Greening, MD  Encounter Date: 07/29/2017      PT End of Session - 07/29/17 1321    Visit Number 6   Number of Visits 16   Date for PT Re-Evaluation 08/30/17   Authorization Type UHC Medicare & Medicaid   PT Start Time 1322   PT Stop Time 1407   PT Time Calculation (min) 45 min   Activity Tolerance Patient tolerated treatment well   Behavior During Therapy [facility] for tasks assessed/performed      Past Medical History:  Diagnosis Date  . Hyperlipidemia   . Hypertension   . Osteoporosis     Past Surgical History:  Procedure Laterality Date  . APPENDECTOMY    . KNEE SURGERY    . NASAL SINUS SURGERY    . TONSILLECTOMY      There were no vitals filed for this visit.      Subjective Assessment - 07/29/17 1324    Subjective Pt denies any pain today. Admits to not attempting HEP since last visit.   Pertinent History R patellar tendon repair in 2010 - limited rehab at the time due to only had Medicaid insurance coverage   Patient Stated Goals "Be able to walk w/o holding onto to things"   Currently in Pain? No/denies   Pain Score 0-No pain            [facility] PT Assessment - 07/29/17 1322      Ambulation/Gait   Gait velocity 2.04 ft/sec     Standardized Balance Assessment   10 Meter Walk 16.09"                     [facility] Adult PT Treatment/Exercise - 07/29/17 1322      Lumbar Exercises: Supine   Clam 15 reps;3 seconds   Clam Limitations alt hip ABD/ER with red TB   Bent Knee Raise 15 reps;3 seconds   Bent Knee Raise Limitations brace march with red TB     Knee/Hip Exercises: Aerobic   Nustep lvl 5 x 6' (LE only)     Knee/Hip Exercises: Standing   Terminal  Knee Extension Right;Left;10 reps   Terminal Knee Extension Limitations 5" hold - ball on wall   Functional Squat 10 reps;5 seconds   Functional Squat Limitations counter minisquat - chair behind pt in case of knee buckle     Knee/Hip Exercises: Supine   Short Arc Quad Sets Both;AAROM;10 reps   Short Arc Quad Sets Limitations + NMES to R quads   Hip Adduction Isometric Both;15 reps;Strengthening   Hip Adduction Isometric Limitations 5" ball squeeze + abd brace   Bridges with Clamshell Both;15 reps  + isometric hip ABD with red TB   Straight Leg Raises Right;10 reps;AAROM   Straight Leg Raises Limitations + NMES to R quads     Modalities   Modalities Doctor, general practice Location R quads   Electrical Stimulation Action NMES   Electrical Stimulation Parameters 5 sec on/10 sec off   Electrical Stimulation Goals Neuromuscular facilitation;Strength                  PT Short Term Goals - 07/29/17 1332  PT SHORT TERM GOAL #1   Title Complete initial assessment for flexibility & balance   Status Achieved     PT SHORT TERM GOAL #2   Title Independent with initial HEP   Status Achieved     PT SHORT TERM GOAL #3   Title B quad strength >/= 3/5 to reduce quad lag/instability   Status On-going     PT SHORT TERM GOAL #4   Title Improve gait speed to >/= 2.0 ft/sec with LRAD as indicated to decrease risk for recurrent falls   Status Achieved           PT Long Term Goals - 07/09/17 1311      PT LONG TERM GOAL #1   Title Independent with ongoing HEP +/- gym program    Status On-going     PT LONG TERM GOAL #2   Title B knee AROM 0-130 for normal gait mechanics   Status On-going     PT LONG TERM GOAL #3   Title B hip and knee strength 4-/5 to 4/5 or greater for improved gait stability   Status On-going     PT LONG TERM GOAL #4   Title Gait speed >/= 2.62 ft/sec with or w/o LRAD for improved safety with  community ambulation   Status On-going               Plan - 07/29/17 1325    Clinical Impression Statement Pt pain free today. Reports feeling more confident with moving around and walking with less worry for knees buckling. Gait speed increased by 0.5 ft/sec to 2.04 ft/sec. Incorporated NMES to [employer] with quad strengthening exercises, but still with limited quad control during SLR and SAQ. Pt admitting to not attempting HEP since last therapy session, therefore reviewed exercises to ensure proper technique.   Rehab Potential Good   Clinical Impairments Affecting Rehab Potential R patellar tendon repair in 2010 (limited rehab at the time due to only had Medicaid insurance coverage); frequent falls; osteoprosis   PT Treatment/Interventions Patient/family education;Therapeutic exercise;Neuromuscular re-education;Balance training;Functional mobility training;Therapeutic activities;Gait training;Stair training;Manual techniques;Taping;Dry needling;Electrical Stimulation;Moist Heat;Traction;Cryotherapy;Vasopneumatic Device;Iontophoresis /ml Dexamethasone;ADLs/Self Care Home Management   PT Next Visit Plan LE flexibility; core stabilization & proximal LE strengthening - HEP update as indicated; manual therapy & modalities PRN for pain   Consulted and Agree with Plan of Care Patient      Patient will benefit from skilled therapeutic intervention in order to improve the following deficits and impairments:  Pain, Decreased strength, Decreased range of motion, Impaired flexibility, Abnormal gait, Difficulty walking, Decreased activity tolerance, Decreased balance  Visit Diagnosis: Acute pain of right knee  Acute pain of left knee  Acute bilateral low back pain with bilateral sciatica  Other abnormalities of gait and mobility  Difficulty in walking, not elsewhere classified  Repeated falls     Problem List Patient Active Problem List   Diagnosis Date Noted  . Acute left-sided low  back pain with left-sided sciatica 06/25/2017  . Acute pain of both knees 06/25/2017  . Abnormal perimenopausal bleeding 09/30/2015  . Routine gynecological examination 08/06/2013    Marry Guan, PT, MPT 07/29/2017, 2:40 PM  [facility] [address]  [address] [place], [place], [address] Phone: [phone]   Fax:  [phone]  Name: MYKELA MEWBORN MRN: [id number] Date of Birth: 10-30-1955

## 2017-08-01 ENCOUNTER — Ambulatory Visit: Payer: Medicare Other

## 2017-08-01 DIAGNOSIS — M25561 Pain in right knee: Secondary | ICD-10-CM

## 2017-08-01 DIAGNOSIS — M5441 Lumbago with sciatica, right side: Secondary | ICD-10-CM

## 2017-08-01 DIAGNOSIS — M5442 Lumbago with sciatica, left side: Secondary | ICD-10-CM

## 2017-08-01 DIAGNOSIS — R296 Repeated falls: Secondary | ICD-10-CM

## 2017-08-01 DIAGNOSIS — M25562 Pain in left knee: Secondary | ICD-10-CM

## 2017-08-01 DIAGNOSIS — R262 Difficulty in walking, not elsewhere classified: Secondary | ICD-10-CM

## 2017-08-01 DIAGNOSIS — R2689 Other abnormalities of gait and mobility: Secondary | ICD-10-CM

## 2017-08-01 NOTE — Therapy (Signed)
[facility] [address]  [address] [place], [place], [address] Phone: [phone]   Fax:  [phone]  Physical Therapy Treatment  Patient Details  Name: Mikayla Moyer MRN: [id number] Date of Birth: 11-Aug-1956 Referring Provider: Annell Greening, MD  Encounter Date: 08/01/2017      PT End of Session - 08/01/17 1337    Visit Number 7   Number of Visits 16   Date for PT Re-Evaluation 08/30/17   Authorization Type UHC Medicare & Medicaid   PT Start Time 1308   PT Stop Time 1405   PT Time Calculation (min) 57 min   Activity Tolerance Patient tolerated treatment well   Behavior During Therapy [facility] for tasks assessed/performed      Past Medical History:  Diagnosis Date  . Hyperlipidemia   . Hypertension   . Osteoporosis     Past Surgical History:  Procedure Laterality Date  . APPENDECTOMY    . KNEE SURGERY    . NASAL SINUS SURGERY    . TONSILLECTOMY      There were no vitals filed for this visit.      Subjective Assessment - 08/01/17 1309    Subjective Pt. noting LBP some mornings which "eases off" as day continues.  Has had restless leg sensation over last two days   Patient Stated Goals "Be able to walk w/o holding onto to things"   Currently in Pain? Yes   Pain Score 6    Pain Location Leg   Pain Orientation Right;Left   Pain Descriptors / Indicators Restless   Pain Type Acute pain   Pain Onset More than a month ago   Pain Frequency Constant   Aggravating Factors  nothing   Pain Relieving Factors support stockings; rubbing LE's    Multiple Pain Sites No                         [facility] Adult PT Treatment/Exercise - 08/01/17 1318      Lumbar Exercises: Supine   Bent Knee Raise 15 reps;3 seconds   Bent Knee Raise Limitations brace march with red TB     Knee/Hip Exercises: Aerobic   Nustep lvl 5 x 4' (LE only)     Knee/Hip Exercises: Standing   Terminal Knee Extension Right;Left;10 reps    Terminal Knee Extension Limitations 5" hold - ball on wall   Functional Squat 3 seconds;15 reps   Functional Squat Limitations counter minisquat - chair behind pt in case of knee buckle     Knee/Hip Exercises: Seated   Long Arc Quad Right;Left;10 reps   Long Arc Quad Limitations AAROM therapist assisted; 3" lowering; pt. with limited control 0-45 dg    Ball Squeeze 15X5"   Hamstring Curl 15 reps;Right;Left;Strengthening   Hamstring Limitations red TB     Knee/Hip Exercises: Supine   Short Arc Quad Sets Both;AAROM;10 reps   Short Arc Quad Sets Limitations AAROM from therapist; 3" lowering    Hip Adduction Isometric Both;15 reps;Strengthening   Hip Adduction Isometric Limitations 5" ball squeeze + abd brace   Bridges with Clamshell Both;15 reps  with sustained hip abd/ER with red TB at knees    Straight Leg Raises Right;10 reps;AAROM   Straight Leg Raises Limitations AA with therapist; 3" lowering    Other Supine Knee/Hip Exercises B straight leg bridge with cues for quad set 3" x 10 reps  PT Short Term Goals - 07/29/17 1332      PT SHORT TERM GOAL #1   Title Complete initial assessment for flexibility & balance   Status Achieved     PT SHORT TERM GOAL #2   Title Independent with initial HEP   Status Achieved     PT SHORT TERM GOAL #3   Title B quad strength >/= 3/5 to reduce quad lag/instability   Status On-going     PT SHORT TERM GOAL #4   Title Improve gait speed to >/= 2.0 ft/sec with LRAD as indicated to decrease risk for recurrent falls   Status Achieved           PT Long Term Goals - 07/09/17 1311      PT LONG TERM GOAL #1   Title Independent with ongoing HEP +/- gym program    Status On-going     PT LONG TERM GOAL #2   Title B knee AROM 0-130 for normal gait mechanics   Status On-going     PT LONG TERM GOAL #3   Title B hip and knee strength 4-/5 to 4/5 or greater for improved gait stability   Status On-going     PT LONG  TERM GOAL #4   Title Gait speed >/= 2.62 ft/sec with or w/o LRAD for improved safety with community ambulation   Status On-going               Plan - 08/01/17 1416    Clinical Impression Statement Mikayla Moyer noting no LBP or knee pain today and continues to feel more confident walking with less sensation of buckling.  Pt. noting she has had intermittent feelings of "restless legs" over past two days.  Treatment with lumbopelvic/LE strengthening and focus on B VMO/TKE strengthening.  Pt. still with limited quad control with SLR and SAQ activities.  Verbalized fatigue in quad muscles following standing TKE training.  Admits to inconsistent HEP adherence and strongly encouraged to perform HEP daily for full benefit from therapy.     PT Treatment/Interventions Patient/family education;Therapeutic exercise;Neuromuscular re-education;Balance training;Functional mobility training;Therapeutic activities;Gait training;Stair training;Manual techniques;Taping;Dry needling;Electrical Stimulation;Moist Heat;Traction;Cryotherapy;Vasopneumatic Device;Iontophoresis /ml Dexamethasone;ADLs/Self Care Home Management   PT Next Visit Plan LE flexibility; core stabilization & proximal LE strengthening - HEP update as indicated; manual therapy & modalities PRN for pain      Patient will benefit from skilled therapeutic intervention in order to improve the following deficits and impairments:  Pain, Decreased strength, Decreased range of motion, Impaired flexibility, Abnormal gait, Difficulty walking, Decreased activity tolerance, Decreased balance  Visit Diagnosis: Acute pain of right knee  Acute pain of left knee  Acute bilateral low back pain with bilateral sciatica  Other abnormalities of gait and mobility  Difficulty in walking, not elsewhere classified  Repeated falls     Problem List Patient Active Problem List   Diagnosis Date Noted  . Acute left-sided low back pain with left-sided sciatica  06/25/2017  . Acute pain of both knees 06/25/2017  . Abnormal perimenopausal bleeding 09/30/2015  . Routine gynecological examination 08/06/2013    Kermit Balo, PTA 08/01/17 2:28 PM   [facility] [address]  [address] [place], [place], [address] Phone: [phone]   Fax:  [phone]  Name: Mikayla Moyer MRN: [id number] Date of Birth: [birth year]

## 2017-08-05 ENCOUNTER — Ambulatory Visit: Payer: Medicare Other

## 2017-08-05 DIAGNOSIS — M5442 Lumbago with sciatica, left side: Secondary | ICD-10-CM

## 2017-08-05 DIAGNOSIS — M25562 Pain in left knee: Secondary | ICD-10-CM

## 2017-08-05 DIAGNOSIS — M5441 Lumbago with sciatica, right side: Secondary | ICD-10-CM

## 2017-08-05 DIAGNOSIS — M25561 Pain in right knee: Secondary | ICD-10-CM | POA: Diagnosis not present

## 2017-08-05 DIAGNOSIS — R262 Difficulty in walking, not elsewhere classified: Secondary | ICD-10-CM

## 2017-08-05 DIAGNOSIS — R2689 Other abnormalities of gait and mobility: Secondary | ICD-10-CM

## 2017-08-05 NOTE — Therapy (Signed)
[facility] [address]  [place], [place], [address] Phone: [phone]   Fax:  [phone]  Physical Therapy Treatment  Patient Details  Name: Mikayla Moyer MRN: [id number] Date of Birth: 03-05-1956 Referring Provider: Rodell Perna, MD  Encounter Date: 08/05/2017      PT End of Session - 08/05/17 1323    Visit Number 8   Number of Visits 16   Date for PT Re-Evaluation 08/30/17   Authorization Type UHC Medicare & Medicaid   PT Start Time 1316   PT Stop Time 1403   PT Time Calculation (min) 47 min   Activity Tolerance Patient tolerated treatment well   Behavior During Therapy [facility] for tasks assessed/performed      Past Medical History:  Diagnosis Date  . Hyperlipidemia   . Hypertension   . Osteoporosis     Past Surgical History:  Procedure Laterality Date  . APPENDECTOMY    . KNEE SURGERY    . NASAL SINUS SURGERY    . TONSILLECTOMY      There were no vitals filed for this visit.      Subjective Assessment - 08/05/17 1320    Subjective Pt. noting some LE fatigue following day after therapy however this subsided.  Pt. noting a "restless" leg sensation while sitting yesterday which subsided.     Patient Stated Goals "Be able to walk w/o holding onto to things"   Currently in Pain? No/denies   Pain Score 0-No pain   Multiple Pain Sites No                         [facility] Adult PT Treatment/Exercise - 08/05/17 1327      Knee/Hip Exercises: Aerobic   Stationary Bike Recumbent bike Lvl 2, 6 min      Knee/Hip Exercises: Standing   Forward Lunges Right;15 reps;3 seconds   Forward Lunges Limitations mini lunge encouraging weight acceptance over R LE; therapist tactile blocking to R quad with mini lunge   1 UE support on TM   Terminal Knee Extension Right;Left;15 reps   Terminal Knee Extension Limitations 5" hold - ball on wall   Functional Squat 3 seconds;15 reps   Functional Squat  Limitations counter minisquat - chair behind pt in case of knee buckle  constant tactile cueing for R wt. shift   Other Standing Knee Exercises B TKE with green TB with therapist 3" x 10 reps  1ski pole support     Knee/Hip Exercises: Seated   Long Arc Quad Right;Left;10 reps   Long Arc Quad Limitations less substitutions and improved ROM; R ROM <L ROM    Ball Squeeze 15X5"   Hamstring Curl Right;Left;10 reps   Hamstring Limitations green TB                  PT Short Term Goals - 07/29/17 1332      PT SHORT TERM GOAL #1   Title Complete initial assessment for flexibility & balance   Status Achieved     PT SHORT TERM GOAL #2   Title Independent with initial HEP   Status Achieved     PT SHORT TERM GOAL #3   Title B quad strength >/= 3/5 to reduce quad lag/instability   Status On-going     PT SHORT TERM GOAL #4   Title Improve gait speed to >/= 2.0 ft/sec with LRAD as indicated to decrease risk for recurrent  falls   Status Achieved           PT Long Term Goals - 07/09/17 1311      PT LONG TERM GOAL #1   Title Independent with ongoing HEP +/- gym program    Status On-going     PT LONG TERM GOAL #2   Title B knee AROM 0-130 for normal gait mechanics   Status On-going     PT LONG TERM GOAL #3   Title B hip and knee strength 4-/5 to 4/5 or greater for improved gait stability   Status On-going     PT LONG TERM GOAL #4   Title Gait speed >/= 2.62 ft/sec with or w/o LRAD for improved safety with community ambulation   Status On-going               Plan - 08/05/17 1330    Clinical Impression Statement Pt. doing well today noting some muscular soreness following last visit, which subsided next day.  Mikayla Moyer doing well with all open and [facility] quad strengthening activities today.  Some advancement in resistance with LE strengthening today, which was tolerated well.  Pt. noting she is trying to walk around house more throughout day to stay active.  No longer  feeling "buckling" feelings at knees when wt. bearing.  Knee pain less frequent.  Seems to be progressing well however has still visibly not met last remaining STG.  AROM with LAQ visibly improved today.     PT Treatment/Interventions Patient/family education;Therapeutic exercise;Neuromuscular re-education;Balance training;Functional mobility training;Therapeutic activities;Gait training;Stair training;Manual techniques;Taping;Dry needling;Electrical Stimulation;Moist Heat;Traction;Cryotherapy;Vasopneumatic Device;Iontophoresis 28m/ml Dexamethasone;ADLs/Self Care Home Management   PT Next Visit Plan LE flexibility; core stabilization & proximal LE strengthening - HEP update as indicated; manual therapy & modalities PRN for pain      Patient will benefit from skilled therapeutic intervention in order to improve the following deficits and impairments:  Pain, Decreased strength, Decreased range of motion, Impaired flexibility, Abnormal gait, Difficulty walking, Decreased activity tolerance, Decreased balance  Visit Diagnosis: Acute pain of right knee  Acute pain of left knee  Acute bilateral low back pain with bilateral sciatica  Other abnormalities of gait and mobility  Difficulty in walking, not elsewhere classified     Problem List Patient Active Problem List   Diagnosis Date Noted  . Acute left-sided low back pain with left-sided sciatica 06/25/2017  . Acute pain of both knees 06/25/2017  . Abnormal perimenopausal bleeding 09/30/2015  . Routine gynecological examination 08/06/2013    MBess Harvest PTA 08/05/17 2:23 PM  [facility] [address] [place] [address]: [phone]  Fax:  [phone] Name: Mikayla ROSETTIMRN: [id number] of Birth: [birth year]

## 2017-08-08 ENCOUNTER — Ambulatory Visit: Payer: Medicare Other | Admitting: Physical Therapy

## 2017-08-12 ENCOUNTER — Ambulatory Visit: Payer: Medicare Other

## 2017-08-12 DIAGNOSIS — R2689 Other abnormalities of gait and mobility: Secondary | ICD-10-CM

## 2017-08-12 DIAGNOSIS — M5442 Lumbago with sciatica, left side: Secondary | ICD-10-CM

## 2017-08-12 DIAGNOSIS — R296 Repeated falls: Secondary | ICD-10-CM

## 2017-08-12 DIAGNOSIS — M25562 Pain in left knee: Secondary | ICD-10-CM

## 2017-08-12 DIAGNOSIS — M25561 Pain in right knee: Secondary | ICD-10-CM | POA: Diagnosis not present

## 2017-08-12 DIAGNOSIS — M5441 Lumbago with sciatica, right side: Secondary | ICD-10-CM

## 2017-08-12 DIAGNOSIS — R262 Difficulty in walking, not elsewhere classified: Secondary | ICD-10-CM

## 2017-08-12 NOTE — Therapy (Signed)
[facility] [address]  [address] [place], [place], [address] Phone: [phone]   Fax:  [phone]  Physical Therapy Treatment  Patient Details  Name: Mikayla Moyer MRN: [id number] Date of Birth: Jul 13, 1956 Referring Provider: Annell Greening, MD  Encounter Date: 08/12/2017      PT End of Session - 08/12/17 1321    Visit Number 9   Number of Visits 16   Date for PT Re-Evaluation 08/30/17   Authorization Type UHC Medicare & Medicaid   PT Start Time 1315   PT Stop Time 1420   PT Time Calculation (min) 65 min   Activity Tolerance Patient tolerated treatment well   Behavior During Therapy [facility] for tasks assessed/performed      Past Medical History:  Diagnosis Date  . Hyperlipidemia   . Hypertension   . Osteoporosis     Past Surgical History:  Procedure Laterality Date  . APPENDECTOMY    . KNEE SURGERY    . NASAL SINUS SURGERY    . TONSILLECTOMY      There were no vitals filed for this visit.      Subjective Assessment - 08/12/17 1319    Subjective Pt. noting she "got some L knee pain this morning" while walking and unable to identify triggering event.  Pt. noting she wore compression sleeve around L knee which relieved pain.     Patient Stated Goals "Be able to walk w/o holding onto to things"   Currently in Pain? No/denies   Pain Score 0-No pain   Multiple Pain Sites No                         [facility] Adult PT Treatment/Exercise - 08/12/17 1333      Knee/Hip Exercises: Aerobic   Stationary Bike Recumbent bike Lvl 2, 6 min      Knee/Hip Exercises: Standing   Forward Lunges Right;15 reps;3 seconds;Left   Forward Lunges Limitations mini lunge with green TB TKE    Terminal Knee Extension Right;Left;15 reps   Terminal Knee Extension Limitations 5" hold - ball on wall   Functional Squat 3 seconds;15 reps   Functional Squat Limitations counter minisquat - chair behind pt. in case of buckling    Other Standing Knee Exercises B TKE with green TB in door 3" x 15 reps; light UE support on chair      Knee/Hip Exercises: Seated   Long Arc Quad Right;Left;10 reps   Long Arc Quad Limitations improved ROM; Only lacking ~ 30 dg near TKE    Other Seated Knee/Hip Exercises R/L Fitter leg press (1 blue band, 1 black band) x 15 each   Hamstring Curl Right;Left;10 reps   Hamstring Limitations green TB     Knee/Hip Exercises: Supine   Short Arc Quad Sets Both;AAROM;10 reps   Short Arc Quad Sets Limitations + NMES to R quad     Emergency planning/management officer R quads   Statistician Action NMES (russian current)    Statistician Parameters 4 sec on/12 sec off   Statistician Goals Neuromuscular facilitation;Strength                  PT Short Term Goals - 07/29/17 1332      PT SHORT TERM GOAL #1   Title Complete initial assessment for flexibility & balance   Status Achieved    [facility] TERM GOAL #2  Title Independent with initial HEP   Status Achieved     PT SHORT TERM GOAL #3   Title B quad strength >/= 3/5 to reduce quad lag/instability   Status On-going     PT SHORT TERM GOAL #4   Title Improve gait speed to >/= 2.0 ft/sec with LRAD as indicated to decrease risk for recurrent falls   Status Achieved           PT Long Term Goals - 07/09/17 1311      PT LONG TERM GOAL #1   Title Independent with ongoing HEP +/- gym program    Status On-going     PT LONG TERM GOAL #2   Title B knee AROM 0-130 for normal gait mechanics   Status On-going     PT LONG TERM GOAL #3   Title B hip and knee strength 4-/5 to 4/5 or greater for improved gait stability   Status On-going     PT LONG TERM GOAL #4   Title Gait speed >/= 2.62 ft/sec with or w/o LRAD for improved safety with community ambulation   Status On-going               Plan - 08/12/17 1328    Clinical Impression Statement Mikayla Moyer doing well  today reporting she felt good following last visit.  Had some L knee pain this morning without known trigger, which resolved after wearing knee brace.  Tolerated all LE strengthening activities well today.  NMES to R quad continued today to encourage improved recruitment and improve functional TKE strength.  Pt. tolerated all activities in treatment well.  Admitting to sporadic HEP performance.  Pt. instructed to perform HEP daily for full benefit from therapy.   Clinical Impairments Affecting Rehab Potential R patellar tendon repair in 2010 (limited rehab at the time due to only had Medicaid insurance coverage); frequent falls; osteoprosis   PT Treatment/Interventions Patient/family education;Therapeutic exercise;Neuromuscular re-education;Balance training;Functional mobility training;Therapeutic activities;Gait training;Stair training;Manual techniques;Taping;Dry needling;Electrical Stimulation;Moist Heat;Traction;Cryotherapy;Vasopneumatic Device;Iontophoresis /ml Dexamethasone;ADLs/Self Care Home Management   PT Next Visit Plan LE flexibility; core stabilization & proximal LE strengthening - HEP update as indicated; manual therapy & modalities PRN for pain      Patient will benefit from skilled therapeutic intervention in order to improve the following deficits and impairments:  Pain, Decreased strength, Decreased range of motion, Impaired flexibility, Abnormal gait, Difficulty walking, Decreased activity tolerance, Decreased balance  Visit Diagnosis: Acute pain of right knee  Acute pain of left knee  Acute bilateral low back pain with bilateral sciatica  Other abnormalities of gait and mobility  Difficulty in walking, not elsewhere classified  Repeated falls     Problem List Patient Active Problem List   Diagnosis Date Noted  . Acute left-sided low back pain with left-sided sciatica 06/25/2017  . Acute pain of both knees 06/25/2017  . Abnormal perimenopausal bleeding 09/30/2015   . Routine gynecological examination 08/06/2013    Kermit Balo, PTA 08/12/17 2:39 PM  [facility] [address]  [address] [place], [place], [address] Phone: [phone]   Fax:  [phone]  Name: Mikayla Moyer MRN: [id number] Date of Birth: 1956-01-22

## 2017-08-15 ENCOUNTER — Ambulatory Visit: Payer: Medicare Other | Admitting: Physical Therapy

## 2017-08-15 DIAGNOSIS — M25561 Pain in right knee: Secondary | ICD-10-CM

## 2017-08-15 DIAGNOSIS — M5442 Lumbago with sciatica, left side: Secondary | ICD-10-CM

## 2017-08-15 DIAGNOSIS — R296 Repeated falls: Secondary | ICD-10-CM

## 2017-08-15 DIAGNOSIS — M5441 Lumbago with sciatica, right side: Secondary | ICD-10-CM

## 2017-08-15 DIAGNOSIS — R262 Difficulty in walking, not elsewhere classified: Secondary | ICD-10-CM

## 2017-08-15 DIAGNOSIS — R2689 Other abnormalities of gait and mobility: Secondary | ICD-10-CM

## 2017-08-15 DIAGNOSIS — M25562 Pain in left knee: Secondary | ICD-10-CM

## 2017-08-15 NOTE — Therapy (Signed)
[facility] [address]  [place], [place], [address] Phone: [phone]   Fax:  [phone]  Physical Therapy Treatment  Patient Details  Name: Mikayla Moyer MRN: [id number] Date of Birth: [birth year] Referring Provider: Rodell Perna, MD  Encounter Date: 08/15/2017      PT End of Session - 08/15/17 3559    Visit Number 10   Number of Visits 16   Date for PT Re-Evaluation 08/30/17   Authorization Type UHC Medicare & Medicaid   PT Start Time 1312   PT Stop Time 1359   PT Time Calculation (min) 47 min   Activity Tolerance Patient tolerated treatment well   Behavior During Therapy [facility] for tasks assessed/performed      Past Medical History:  Diagnosis Date  . Hyperlipidemia   . Hypertension   . Osteoporosis     Past Surgical History:  Procedure Laterality Date  . APPENDECTOMY    . KNEE SURGERY    . NASAL SINUS SURGERY    . TONSILLECTOMY      There were no vitals filed for this visit.      Subjective Assessment - 08/15/17 1316    Subjective Pt reporting some soreness in her knee this morning, but better since putting the elastic brace on.   Pertinent History R patellar tendon repair in 2010 - limited rehab at the time due to only had Medicaid insurance coverage   Patient Stated Goals "Be able to walk w/o holding onto to things"   Currently in Pain? No/denies   Pain Score 0-No pain            [facility] PT Assessment - 08/15/17 1312      Assessment   Medical Diagnosis Acute B knee pain & LBP   Referring Provider Rodell Perna, MD   Onset Date/Surgical Date 06/08/17   Next MD Visit 08/27/17     Observation/Other Assessments   Focus on Therapeutic Outcomes (FOTO)  Knee - 42% (58% limitation)     AROM   Right Knee Extension 33   Right Knee Flexion 137   Left Knee Extension 27   Left Knee Flexion 136     Strength   Right Hip Flexion 4/5   Right Hip Extension 4-/5   Right Hip External Rotation   4-/5   Right Hip Internal Rotation 4-/5   Right Hip ABduction 4-/5   Right Hip ADduction 3+/5   Left Hip Flexion 4/5   Left Hip Extension 4-/5   Left Hip External Rotation 4-/5   Left Hip Internal Rotation 4-/5   Left Hip ABduction 4/5   Left Hip ADduction 3+/5   Right Knee Flexion 4-/5   Right Knee Extension 3-/5   Left Knee Flexion 4-/5   Left Knee Extension 3-/5     Ambulation/Gait   Gait velocity 2.27 ft/sec     Standardized Balance Assessment   10 Meter Walk 14.44"                     [facility] Adult PT Treatment/Exercise - 08/15/17 1312      Exercises   Exercises Knee/Hip     Knee/Hip Exercises: Stretches   Gastroc Stretch Both;30 seconds;2 reps   Gastroc Stretch Limitations prostretch      Knee/Hip Exercises: Aerobic   Recumbent Bike lvl 2 x 6'     Knee/Hip Exercises: Machines for Strengthening   Cybex Knee Flexion B LE 15# x10; B con/alt ecc  15# x10 each   Cybex Leg Press B LE 15# x15                  PT Short Term Goals - Aug 30, 2017 1320      PT SHORT TERM GOAL #1   Title Complete initial assessment for flexibility & balance   Status Achieved     PT SHORT TERM GOAL #2   Title Independent with initial HEP   Status Achieved     PT SHORT TERM GOAL #3   Title B quad strength >/= 3/5 to reduce quad lag/instability   Status On-going     PT SHORT TERM GOAL #4   Title Improve gait speed to >/= 2.0 ft/sec with LRAD as indicated to decrease risk for recurrent falls   Status Achieved           PT Long Term Goals - 08-30-17 1320      PT LONG TERM GOAL #1   Title Independent with ongoing HEP +/- gym program    Status On-going     PT LONG TERM GOAL #2   Title B knee AROM 0-130 for normal gait mechanics   Status Partially Met  met for B knee flexion ROM     PT LONG TERM GOAL #3   Title B hip and knee strength 4-/5 to 4/5 or greater for improved gait stability   Status Partially Met     PT LONG TERM GOAL #4   Title Gait speed  >/= 2.62 ft/sec with or w/o LRAD for improved safety with community ambulation   Status On-going               Plan - 08-30-2017 1320    Clinical Impression Statement [name] has demonstrated good progress with PT with lessening B knee and low back pain, improving LE strength and increasing gait speed and stability. Greatest deficit remains in B knee extension ROM and strength with significant quad lag persisting bilaterally, although improved from initial eval. [name] demonstates good potential to benefit from further skilled PT for further strengthening and restoration of full knee AROM into extension.   Clinical Impairments Affecting Rehab Potential R patellar tendon repair in 2010 (limited rehab at the time due to only had Medicaid insurance coverage); frequent falls; osteoprosis   PT Treatment/Interventions Patient/family education;Therapeutic exercise;Neuromuscular re-education;Balance training;Functional mobility training;Therapeutic activities;Gait training;Stair training;Manual techniques;Taping;Dry needling;Electrical Stimulation;Moist Heat;Traction;Cryotherapy;Vasopneumatic Device;Iontophoresis 45m/ml Dexamethasone;ADLs/Self Care Home Management   PT Next Visit Plan LE flexibility; core stabilization & proximal LE strengthening - HEP update as indicated; manual therapy & modalities PRN for pain      Patient will benefit from skilled therapeutic intervention in order to improve the following deficits and impairments:  Pain, Decreased strength, Decreased range of motion, Impaired flexibility, Abnormal gait, Difficulty walking, Decreased activity tolerance, Decreased balance  Visit Diagnosis: Acute pain of right knee  Acute pain of left knee  Acute bilateral low back pain with bilateral sciatica  Other abnormalities of gait and mobility  Difficulty in walking, not elsewhere classified  Repeated falls       G-Codes - 111/02/20181400    Functional Assessment Tool Used  (Outpatient Only) Knee FOTO = 42% (58% limitation)   Functional Limitation Mobility: Walking and moving around   Mobility: Walking and Moving Around Current Status ((M3846 At least 40 percent but less than 60 percent impaired, limited or restricted   Mobility: Walking and Moving Around Goal Status ((K5993 At least 40 percent but less than 60 percent  impaired, limited or restricted      Problem List Patient Active Problem List   Diagnosis Date Noted  . Acute left-sided low back pain with left-sided sciatica 06/25/2017  . Acute pain of both knees 06/25/2017  . Abnormal perimenopausal bleeding 09/30/2015  . Routine gynecological examination 08/06/2013    Percival Spanish, PT, MPT 08/15/2017, 2:02 PM  [facility] [address]  [place], [place], [address] Phone: [phone]   Fax:  [phone]  Name: ALEXINA NICCOLI MRN: [id number] Date of Birth: 04/29/1956

## 2017-08-19 ENCOUNTER — Ambulatory Visit: Payer: Medicare Other

## 2017-08-19 DIAGNOSIS — R262 Difficulty in walking, not elsewhere classified: Secondary | ICD-10-CM

## 2017-08-19 DIAGNOSIS — M5442 Lumbago with sciatica, left side: Secondary | ICD-10-CM

## 2017-08-19 DIAGNOSIS — M25561 Pain in right knee: Secondary | ICD-10-CM | POA: Diagnosis not present

## 2017-08-19 DIAGNOSIS — R296 Repeated falls: Secondary | ICD-10-CM

## 2017-08-19 DIAGNOSIS — M25562 Pain in left knee: Secondary | ICD-10-CM

## 2017-08-19 DIAGNOSIS — M5441 Lumbago with sciatica, right side: Secondary | ICD-10-CM

## 2017-08-19 DIAGNOSIS — R2689 Other abnormalities of gait and mobility: Secondary | ICD-10-CM

## 2017-08-19 NOTE — Therapy (Signed)
[facility] [address]  [place], [place], [address] Phone: [phone]   Fax:  [phone]  Physical Therapy Treatment  Patient Details  Name: Mikayla Moyer MRN: [id number] Date of Birth: [birth year] Referring Provider: Rodell Perna, MD  Encounter Date: 08/19/2017      PT End of Session - 08/19/17 1321    Visit Number 11   Number of Visits 16   Date for PT Re-Evaluation 08/30/17   Authorization Type UHC Medicare & Medicaid   PT Start Time 1315   PT Stop Time 1400   PT Time Calculation (min) 45 min   Activity Tolerance Patient tolerated treatment well   Behavior During Therapy [facility] for tasks assessed/performed      Past Medical History:  Diagnosis Date  . Hyperlipidemia   . Hypertension   . Osteoporosis     Past Surgical History:  Procedure Laterality Date  . APPENDECTOMY    . KNEE SURGERY    . NASAL SINUS SURGERY    . TONSILLECTOMY      There were no vitals filed for this visit.      Subjective Assessment - 08/19/17 1320    Subjective Pt. reporting she feels she "gained a lot of strength in the legs" over weekend.  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     Patient Stated Goals "Be able to walk w/o holding onto to things"   Currently in Pain? No/denies   Pain Score 0-No pain   Multiple Pain Sites No                         [facility] Adult PT Treatment/Exercise - 08/19/17 1333      Knee/Hip Exercises: Aerobic   Recumbent Bike lvl 2 x 6'     Knee/Hip Exercises: Standing   Hip Flexion Right;Left;10 reps;Knee bent   Hip Flexion Limitations UE support on counter    Forward Lunges Right;15 reps;3 seconds;Left   Forward Lunges Limitations mini lunge with green TB TKE    Terminal Knee Extension Right;Left;15 reps   Terminal Knee Extension Limitations 5" hold - ball on wall   Hip Abduction Right;Left;Knee straight;10 reps    Abduction Limitations UE support on counter   Hip Extension Right;Left;10 reps;Knee straight     Knee/Hip Exercises: Seated   Hamstring Curl Right;Left;10 reps   Hamstring Limitations green TB     Knee/Hip Exercises: Supine   Short Arc Quad Sets Both;AAROM;10 reps   Short Arc Quad Sets Limitations + NMES to [employer] Action NMES (russian current)   Printmaker Parameters 4sec on/12 sec off  8 min    Electrical Stimulation Goals Neuromuscular facilitation;Strength                  PT Short Term Goals - 08/15/17 1320      PT SHORT TERM GOAL #1   Title Complete initial assessment for flexibility & balance   Status Achieved     PT SHORT TERM GOAL #2   Title Independent with initial HEP   Status Achieved     PT SHORT TERM GOAL #3   Title B quad strength >/= 3/5 to reduce quad lag/instability   Status On-going     PT SHORT TERM GOAL #4   Title  Improve gait speed to >/= 2.0 ft/sec with LRAD as indicated to decrease risk for recurrent falls   Status Achieved           PT Long Term Goals - 08/15/17 1320      PT LONG TERM GOAL #1   Title Independent with ongoing HEP +/- gym program    Status On-going     PT LONG TERM GOAL #2   Title B knee AROM 0-130 for normal gait mechanics   Status Partially Met  met for B knee flexion ROM     PT LONG TERM GOAL #3   Title B hip and knee strength 4-/5 to 4/5 or greater for improved gait stability   Status Partially Met     PT LONG TERM GOAL #4   Title Gait speed >/= 2.62 ft/sec with or w/o LRAD for improved safety with community ambulation   Status On-going               Plan - 08/19/17 1331    Clinical Impression Statement [name] doing well today reporting 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  Tolerated all strengthening therex well today focused on quad /VMO  strengthening activities.  Initiated more standing activities with pt. requiring less seated rest breaks.  Reports no recent falls and improved tolerance for standing housework and errands.  NMES continued today along with quad/VMO strengthening activities for improved quad control and safety with gait.  Pt. tolerated all activities in treatment well and reports daily HEP adherence.  Progressing well toward goals.   Clinical Impairments Affecting Rehab Potential R patellar tendon repair in 2010 (limited rehab at the time due to only had Medicaid insurance coverage); frequent falls; osteoprosis   PT Treatment/Interventions Patient/family education;Therapeutic exercise;Neuromuscular re-education;Balance training;Functional mobility training;Therapeutic activities;Gait training;Stair training;Manual techniques;Taping;Dry needling;Electrical Stimulation;Moist Heat;Traction;Cryotherapy;Vasopneumatic Device;Iontophoresis 68m/ml Dexamethasone;ADLs/Self Care Home Management   PT Next Visit Plan LE flexibility; core stabilization & proximal LE strengthening - HEP update as indicated; manual therapy & modalities PRN for pain      Patient will benefit from skilled therapeutic intervention in order to improve the following deficits and impairments:  Pain, Decreased strength, Decreased range of motion, Impaired flexibility, Abnormal gait, Difficulty walking, Decreased activity tolerance, Decreased balance  Visit Diagnosis: Acute pain of right knee  Acute pain of left knee  Acute bilateral low back pain with bilateral sciatica  Other abnormalities of gait and mobility  Difficulty in walking, not elsewhere classified  Repeated falls     Problem List Patient Active Problem List   Diagnosis Date Noted  . Acute left-sided low back pain with left-sided sciatica 06/25/2017  . Acute pain of both knees 06/25/2017  . Abnormal perimenopausal bleeding 09/30/2015  . Routine gynecological examination 08/06/2013     MBess Harvest PTA 08/19/17 9:10 PM   [facility] [place] [address]Phone: [phone]  Fax:  [phone] Name: Mikayla HERZBERGMRN: [id number] of Birth: [birth year]

## 2017-08-22 ENCOUNTER — Ambulatory Visit: Payer: Medicare Other | Admitting: Physical Therapy

## 2017-08-22 DIAGNOSIS — R2689 Other abnormalities of gait and mobility: Secondary | ICD-10-CM

## 2017-08-22 DIAGNOSIS — M25562 Pain in left knee: Secondary | ICD-10-CM

## 2017-08-22 DIAGNOSIS — R296 Repeated falls: Secondary | ICD-10-CM

## 2017-08-22 DIAGNOSIS — M5442 Lumbago with sciatica, left side: Secondary | ICD-10-CM

## 2017-08-22 DIAGNOSIS — R262 Difficulty in walking, not elsewhere classified: Secondary | ICD-10-CM

## 2017-08-22 DIAGNOSIS — M25561 Pain in right knee: Secondary | ICD-10-CM

## 2017-08-22 DIAGNOSIS — M5441 Lumbago with sciatica, right side: Secondary | ICD-10-CM

## 2017-08-22 NOTE — Therapy (Signed)
[facility] [address]  [place], [place], [address] Phone: [phone]   Fax:  [phone]  Physical Therapy Treatment  Patient Details  Name: Mikayla Moyer MRN: [id number] Date of Birth: 11-15-1955 Referring Provider: Rodell Perna, MD  Encounter Date: 08/22/2017      PT End of Session - 08/22/17 1313    Visit Number 12   Number of Visits 16   Date for PT Re-Evaluation 08/30/17   Authorization Type UHC Medicare & Medicaid   PT Start Time 6962   PT Stop Time 1357   PT Time Calculation (min) 44 min   Activity Tolerance Patient tolerated treatment well   Behavior During Therapy [facility] for tasks assessed/performed      Past Medical History:  Diagnosis Date  . Hyperlipidemia   . Hypertension   . Osteoporosis     Past Surgical History:  Procedure Laterality Date  . APPENDECTOMY    . KNEE SURGERY    . NASAL SINUS SURGERY    . TONSILLECTOMY      There were no vitals filed for this visit.      Subjective Assessment - 08/22/17 1316    Subjective Pt stating "I woke up today with no pain".   Pertinent History R patellar tendon repair in 2010 - limited rehab at the time due to only had Medicaid insurance coverage   Patient Stated Goals "Be able to walk w/o holding onto to things"   Currently in Pain? No/denies   Pain Score 0-No pain            [facility] PT Assessment - 08/22/17 1313      Assessment   Medical Diagnosis Acute B knee pain & LBP   Referring Provider Rodell Perna, MD   Onset Date/Surgical Date 06/08/17   Next MD Visit 08/27/17     AROM   Right Knee Extension 34   Right Knee Flexion 137   Left Knee Extension 20   Left Knee Flexion 136     Strength   Right Hip Flexion 4/5   Right Hip Extension 4-/5   Right Hip External Rotation  4-/5   Right Hip Internal Rotation 4/5   Right Hip ABduction 4-/5   Right Hip ADduction 4-/5   Left Hip Flexion 4/5   Left Hip Extension 4-/5   Left Hip  External Rotation 4-/5   Left Hip Internal Rotation 4/5   Left Hip ABduction 4/5   Left Hip ADduction 3+/5   Right Knee Flexion 4-/5   Right Knee Extension 3-/5   Left Knee Flexion 4/5   Left Knee Extension 3-/5     Ambulation/Gait   Gait Pattern Step-through pattern;Wide base of support   Gait velocity 2.55 ft/sec     Standardized Balance Assessment   10 Meter Walk 12.85"                     [facility] Adult PT Treatment/Exercise - 08/22/17 1313      Exercises   Exercises Knee/Hip     Knee/Hip Exercises: Aerobic   Recumbent Bike lvl 3 x 6'     Knee/Hip Exercises: Machines for Strengthening   Cybex Leg Press B LE 20# x15; B ankle press (avoiding knee hyperextension) x15     Knee/Hip Exercises: Standing   Terminal Knee Extension Right;Left;15 reps   Theraband Level (Terminal Knee Extension) Level 3 (Green)   Wall Squat 15 reps;5 seconds   Wall Squat  Limitations + hip adduction ball squeeze; PT spotting/guarding at knees, but no buckling evident                  PT Short Term Goals - 08/22/17 1323      PT SHORT TERM GOAL #1   Title Complete initial assessment for flexibility & balance   Status Achieved     PT SHORT TERM GOAL #2   Title Independent with initial HEP   Status Achieved     PT SHORT TERM GOAL #3   Title B quad strength >/= 3/5 to reduce quad lag/instability   Status On-going     PT SHORT TERM GOAL #4   Title Improve gait speed to >/= 2.0 ft/sec with LRAD as indicated to decrease risk for recurrent falls   Status Achieved           PT Long Term Goals - 08/22/17 1323      PT LONG TERM GOAL #1   Title Independent with ongoing HEP +/- gym program    Status On-going     PT LONG TERM GOAL #2   Title B knee AROM 0-130 for normal gait mechanics   Status Partially Met  met for B knee flexion ROM     PT LONG TERM GOAL #3   Title B hip and knee strength 4-/5 to 4/5 or greater for improved gait stability   Status Partially Met      PT LONG TERM GOAL #4   Title Gait speed >/= 2.62 ft/sec with or w/o LRAD for improved safety with community ambulation   Status On-going               Plan - 08/22/17 1318    Clinical Impression Statement Mikayla Moyer has demonstrated good progress with PT with lessening B knee and low back pain, improving LE strength and increasing gait speed and stability with normalizing gait pattern. B quad strength and knee extension AROM the slowest to improve with significant quad lag persisting R>L, but improved from initial eval and pt demonstrating decreased evidence of knee buckling and no recent falls reported. Mikayla Moyer demonstrates good potential to benefit from further skilled PT for further strengthening and restoration of full knee AROM into extension, and anticipate recert at end of current POC next week.   Rehab Potential Good   Clinical Impairments Affecting Rehab Potential R patellar tendon repair in 2010 (limited rehab at the time due to only had Medicaid insurance coverage); frequent falls; osteoprosis   PT Treatment/Interventions Patient/family education;Therapeutic exercise;Neuromuscular re-education;Balance training;Functional mobility training;Therapeutic activities;Gait training;Stair training;Manual techniques;Taping;Dry needling;Electrical Stimulation;Moist Heat;Traction;Cryotherapy;Vasopneumatic Device;Iontophoresis 46m/ml Dexamethasone;ADLs/Self Care Home Management   PT Next Visit Plan LE flexibility; core stabilization & proximal LE strengthening - HEP update as indicated; manual therapy & modalities PRN for pain   Consulted and Agree with Plan of Care Patient      Patient will benefit from skilled therapeutic intervention in order to improve the following deficits and impairments:  Pain, Decreased strength, Decreased range of motion, Impaired flexibility, Abnormal gait, Difficulty walking, Decreased activity tolerance, Decreased balance  Visit Diagnosis: Acute pain of right  knee  Acute pain of left knee  Acute bilateral low back pain with bilateral sciatica  Other abnormalities of gait and mobility  Difficulty in walking, not elsewhere classified  Repeated falls     Problem List Patient Active Problem List   Diagnosis Date Noted  . Acute left-sided low back pain with left-sided sciatica 06/25/2017  . Acute pain of  both knees 06/25/2017  . Abnormal perimenopausal bleeding 09/30/2015  . Routine gynecological examination 08/06/2013    Percival Spanish, PT, MPT 08/22/2017, 3:12 PM  [facility] [address]  [place], [place], [address] Phone: [phone]   Fax:  [phone]  Name: RIELYN KRUPINSKI MRN: [id number] Date of Birth: [birth year]

## 2017-08-26 ENCOUNTER — Ambulatory Visit: Payer: Medicare Other | Admitting: Physical Therapy

## 2017-08-27 ENCOUNTER — Ambulatory Visit (INDEPENDENT_AMBULATORY_CARE_PROVIDER_SITE_OTHER): Payer: Medicare Other | Admitting: Orthopaedic Surgery

## 2017-08-27 ENCOUNTER — Encounter (INDEPENDENT_AMBULATORY_CARE_PROVIDER_SITE_OTHER): Payer: Self-pay | Admitting: Orthopaedic Surgery

## 2017-08-27 VITALS — BP 199/110 | HR 88 | Ht 65.0 in | Wt 281.0 lb

## 2017-08-27 DIAGNOSIS — M25562 Pain in left knee: Secondary | ICD-10-CM

## 2017-08-27 DIAGNOSIS — M25561 Pain in right knee: Secondary | ICD-10-CM | POA: Diagnosis not present

## 2017-08-27 NOTE — Progress Notes (Signed)
Office Visit Note   Patient: Donna ChristenLucille M Kokesh           Date of Birth: 1956-01-04           MRN: [id number] Visit Date: 08/27/2017              Requested by: Fleet ContrasAvbuere, Edwin, MD [address] [place], [place] [id number] PCP: Fleet ContrasAvbuere, Edwin, MD   Assessment & Plan: Visit Diagnoses:  1. Acute pain of both knees   2.   Bilateral lower extremity weakness. 3.  Status post right patellar tendon repair 2010.  Plan: Continue therapy for 1 more month. I think she has 8 visits left per her insurance. I'll recheck her in one month. She'll continue on excise his at home. She is starting noted to benefit and will continue to get better as long she stays with the program.  Follow-Up Instructions: No Follow-up on file.   Orders:  No orders of the defined types were placed in this encounter.  No orders of the defined types were placed in this encounter.     Procedures: No procedures performed   Clinical Data: No additional findings.   Subjective: Chief Complaint  Patient presents with  . Left Knee - Follow-up  . Right Knee - Follow-up    HPI 61 year old female returns with bilateral quad weakness. She has about a 30 extension lag on the right and 20 on the left. She needs to continue with therapy efforts for strengthening and needs to do more exercise and at home to help with her quad strength. She is also working on dieting and has lost some weight and I congratulated her on this.  Review of Systems Review of systems unchanged from 06/20/2017 office visit. Previous right patellar tendon repair 2010. She only had 1 or 2 therapy visits and did not do her home exercises.  Objective: Vital Signs: BP (!) 199/110   Pulse 88   Ht 5\' 5"  (1.651 m)   Wt 281 lb (127.5 kg)   BMI 46.76 kg/m   Physical Exam  Constitutional: She is oriented to person, place, and time. She appears well-developed.  HENT:  Head: Normocephalic.  Right Ear: External ear normal.  Left Ear: External ear  normal.  Eyes: Pupils are equal, round, and reactive to light.  Neck: No tracheal deviation present. No thyromegaly present.  Cardiovascular: Normal rate.   Pulmonary/Chest: Effort normal.  Abdominal: Soft.  Neurological: She is alert and oriented to person, place, and time.  Skin: Skin is warm and dry.  Psychiatric: She has a normal mood and affect. Her behavior is normal.    Ortho Exam patient does not have significant knee effusion. She still has extension lag about 30-35 on the right 20 on the left she is walking much better and is more upright doesn't have to lean at the hips is much deeper center gravity anterior.   Specialty Comments:  No specialty comments available.  Imaging: No results found.   PMFS History: Patient Active Problem List   Diagnosis Date Noted  . Acute left-sided low back pain with left-sided sciatica 06/25/2017  . Acute pain of both knees 06/25/2017  . Abnormal perimenopausal bleeding 09/30/2015  . Routine gynecological examination 08/06/2013   Past Medical History:  Diagnosis Date  . Hyperlipidemia   . Hypertension   . Osteoporosis     No family history on file.  Past Surgical History:  Procedure Laterality Date  . APPENDECTOMY    . KNEE SURGERY    .  NASAL SINUS SURGERY    . TONSILLECTOMY     Social History   Occupational History  . Not on file.   Social History Main Topics  . Smoking status: Never Smoker  . Smokeless tobacco: Never Used  . Alcohol use No  . Drug use: No  . Sexual activity: Not Currently

## 2017-08-29 ENCOUNTER — Ambulatory Visit: Payer: Medicare Other | Attending: Orthopaedic Surgery

## 2017-08-29 DIAGNOSIS — R262 Difficulty in walking, not elsewhere classified: Secondary | ICD-10-CM | POA: Diagnosis present

## 2017-08-29 DIAGNOSIS — M25562 Pain in left knee: Secondary | ICD-10-CM

## 2017-08-29 DIAGNOSIS — M5441 Lumbago with sciatica, right side: Secondary | ICD-10-CM | POA: Diagnosis present

## 2017-08-29 DIAGNOSIS — M25561 Pain in right knee: Secondary | ICD-10-CM

## 2017-08-29 DIAGNOSIS — M5442 Lumbago with sciatica, left side: Secondary | ICD-10-CM | POA: Diagnosis present

## 2017-08-29 DIAGNOSIS — R296 Repeated falls: Secondary | ICD-10-CM

## 2017-08-29 DIAGNOSIS — R2689 Other abnormalities of gait and mobility: Secondary | ICD-10-CM | POA: Diagnosis present

## 2017-08-29 NOTE — Therapy (Signed)
[facility] [address]  [place], [place], [address] Phone: [phone]   Fax:  [phone]  Physical Therapy Treatment  Patient Details  Name: Mikayla Moyer MRN: [id number] Date of Birth: [birth year] Referring Provider: Rodell Perna, MD  Encounter Date: 08/29/2017      PT End of Session - 08/29/17 1416    Visit Number 13   Number of Visits 21   Date for PT Re-Evaluation 09/27/17   Authorization Type [employer] Medicare & Medicaid   PT Start Time 1402   PT Stop Time 1442   PT Time Calculation (min) 40 min   Activity Tolerance Patient tolerated treatment well   Behavior During Therapy [facility] for tasks assessed/performed      Past Medical History:  Diagnosis Date  . Hyperlipidemia   . Hypertension   . Osteoporosis     Past Surgical History:  Procedure Laterality Date  . APPENDECTOMY    . KNEE SURGERY    . NASAL SINUS SURGERY    . TONSILLECTOMY      There were no vitals filed for this visit.      Subjective Assessment - 08/29/17 1405    Subjective Pt. reporting MD wanting another month of therapy.  Pt. reporting she would like to be able to pick something up off the floor without holding on for support.     Patient Stated Goals "Be able to walk w/o holding onto to things"   Currently in Pain? No/denies   Pain Score 0-No pain   Multiple Pain Sites No            [facility] PT Assessment - 08/29/17 1417      Assessment   Medical Diagnosis Acute B knee pain & LBP   Referring Provider Rodell Perna, MD   Onset Date/Surgical Date 06/08/17   Next MD Visit 09/24/17     Strength   Right/Left Hip Right;Left   Right Hip Flexion 4/5   Right Hip Extension 4-/5   Right Hip External Rotation  4-/5   Right Hip Internal Rotation 4/5   Right Hip ABduction 4-/5   Right Hip ADduction 4-/5   Left Hip Flexion 4/5   Left Hip Extension 4-/5   Left Hip External Rotation 4-/5   Left Hip Internal Rotation 4/5   Left Hip  ABduction 4/5   Left Hip ADduction 4-/5   Right/Left Knee Right;Left   Right Knee Flexion 4/5   Right Knee Extension 3-/5   Left Knee Flexion 4/5   Left Knee Extension 3-/5                     [facility] Adult PT Treatment/Exercise - 08/29/17 1427      Ambulation/Gait   Gait Pattern Step-through pattern;Wide base of support   Gait velocity 2.36f/sec      Knee/Hip Exercises: Aerobic   Recumbent Bike lvl 3 x 6'     Knee/Hip Exercises: Standing   Terminal Knee Extension Right;Left;15 reps   Theraband Level (Terminal Knee Extension) Level 3 (Green)   Forward Step Up Step Height: 4";Right;5 reps   Forward Step Up Limitations holding onto machine; terminated due to pt. with hyperextension at R knee    Other Standing Knee Exercises B single leg TKE with therapist anchor green TB and light support at wall x 20 reps each LE      Knee/Hip Exercises: Seated   Other Seated Knee/Hip Exercises R/L Fitter leg press (1  blue band, 1 black band) x 15 each   Sit to Sand 15 reps;with UE support  focusing on eccentric control                 PT Education - 08/29/17 1800    Education provided Yes   Education Details TKE with large green looped TB issued to pt.    Person(s) Educated Patient   Methods Explanation;Demonstration;Verbal cues;Handout   Comprehension Verbalized understanding;Returned demonstration;Verbal cues required;Need further instruction          PT Short Term Goals - 08/29/17 1423      PT SHORT TERM GOAL #1   Title Complete initial assessment for flexibility & balance   Status Achieved     PT SHORT TERM GOAL #2   Title Independent with initial HEP   Status Achieved     PT SHORT TERM GOAL #3   Title B quad strength >/= 3/5 to reduce quad lag/instability   Status On-going   Target Date 09/27/17     PT SHORT TERM GOAL #4   Title Improve gait speed to >/= 2.0 ft/sec with LRAD as indicated to decrease risk for recurrent falls   Status Achieved            PT Long Term Goals - 08/29/17 1423      PT LONG TERM GOAL #1   Title Independent with ongoing HEP +/- gym program    Status On-going   Target Date 09/27/17     PT LONG TERM GOAL #2   Title B knee AROM 0-130 for normal gait mechanics   Status Partially Met  met for B knee flexion ROM   Target Date 09/27/17     PT LONG TERM GOAL #3   Title B hip and knee strength 4-/5 to 4/5 or greater for improved gait stability   Status Partially Met   Target Date 09/27/17     PT LONG TERM GOAL #4   Title Gait speed >/= 2.62 ft/sec with or w/o LRAD for improved safety with community ambulation   Status Achieved               Plan - 08/29/17 1434    Clinical Impression Statement Pt. seen today after f/u with MD with MD pleased with progress and wanting pt. to continue with therapy.  Pt. stating she wishes to be able to "pick up stuff off of floor without holding onto things".  Pt. showing minor strength improvement since last tested on 10.25.18 however continues with ~ 30 dg quad lag on R and ~ 20 dg quad lag on L LE.  Pt. reporting she feels she has improved strength since starting therapy and feels she is more stable and able to walk faster.  Pt. able to meet gait speed goal today.  Pt. will continue to benefit from further skilled therapy to maximize functional strength and increase safety with functional tasks.     Clinical Impairments Affecting Rehab Potential R patellar tendon repair in 2010 (limited rehab at the time due to only had Medicaid insurance coverage); frequent falls; osteoprosis   PT Treatment/Interventions Patient/family education;Therapeutic exercise;Neuromuscular re-education;Balance training;Functional mobility training;Therapeutic activities;Gait training;Stair training;Manual techniques;Taping;Dry needling;Electrical Stimulation;Moist Heat;Traction;Cryotherapy;Vasopneumatic Device;Iontophoresis 57m/ml Dexamethasone;ADLs/Self Care Home Management   PT Next Visit  Plan LE flexibility; core stabilization & proximal LE strengthening - HEP update as indicated; manual therapy & modalities PRN for pain      Patient will benefit from skilled therapeutic intervention in order to improve the  following deficits and impairments:  Pain, Decreased strength, Decreased range of motion, Impaired flexibility, Abnormal gait, Difficulty walking, Decreased activity tolerance, Decreased balance  Visit Diagnosis: Acute pain of right knee  Acute pain of left knee  Acute bilateral low back pain with bilateral sciatica  Other abnormalities of gait and mobility  Difficulty in walking, not elsewhere classified  Repeated falls     Problem List Patient Active Problem List   Diagnosis Date Noted  . Acute left-sided low back pain with left-sided sciatica 06/25/2017  . Acute pain of both knees 06/25/2017  . Abnormal perimenopausal bleeding 09/30/2015  . Routine gynecological examination 08/06/2013   Bess Harvest, PTA 08/29/17 6:59 PM  [facility] [address]  [place], [place], [address] Phone: [phone]   Fax:  [phone]  Name: SHARLYN ODONNEL MRN: [id number] Date of Birth: 08/04/1956   POC extended for additional 2x/wk for 4 weeks at MD request.  Percival Spanish, PT, MPT 08/29/17, 7:30 PM  [facility] [address]  [place], [place], [address] Phone: [phone]   Fax:  [phone]

## 2017-09-02 ENCOUNTER — Encounter: Payer: Self-pay | Admitting: Physical Therapy

## 2017-09-02 ENCOUNTER — Ambulatory Visit: Payer: Medicare Other | Admitting: Physical Therapy

## 2017-09-02 DIAGNOSIS — M25561 Pain in right knee: Secondary | ICD-10-CM | POA: Diagnosis not present

## 2017-09-02 DIAGNOSIS — R296 Repeated falls: Secondary | ICD-10-CM

## 2017-09-02 DIAGNOSIS — M5442 Lumbago with sciatica, left side: Secondary | ICD-10-CM

## 2017-09-02 DIAGNOSIS — M5441 Lumbago with sciatica, right side: Secondary | ICD-10-CM

## 2017-09-02 DIAGNOSIS — R262 Difficulty in walking, not elsewhere classified: Secondary | ICD-10-CM

## 2017-09-02 DIAGNOSIS — M25562 Pain in left knee: Secondary | ICD-10-CM

## 2017-09-02 DIAGNOSIS — R2689 Other abnormalities of gait and mobility: Secondary | ICD-10-CM

## 2017-09-02 NOTE — Therapy (Signed)
[facility] [address]  [place], [place], [address] Phone: [phone]   Fax:  [phone]  Physical Therapy Treatment  Patient Details  Name: Mikayla Moyer MRN: [id number] Date of Birth: October 23, 1956 Referring Provider: Rodell Perna, MD   Encounter Date: 09/02/2017  PT End of Session - 09/02/17 1402    Visit Number  14    Number of Visits  21    Date for PT Re-Evaluation  09/27/17    Authorization Type  [employer]HC Medicare & Medicaid    PT Start Time  1402    PT Stop Time  1445    PT Time Calculation (min)  43 min    Activity Tolerance  Patient tolerated treatment well    Behavior During Therapy  [facility] for tasks assessed/performed       Past Medical History:  Diagnosis Date  . Hyperlipidemia   . Hypertension   . Osteoporosis     Past Surgical History:  Procedure Laterality Date  . APPENDECTOMY    . KNEE SURGERY    . NASAL SINUS SURGERY    . TONSILLECTOMY      There were no vitals filed for this visit.  Subjective Assessment - 09/02/17 1405    Subjective  Pt doing well today.    Pertinent History  R patellar tendon repair in 2010 - limited rehab at the time due to only had Medicaid insurance coverage    Patient Stated Goals  "Be able to walk w/o holding onto to things & pick things up off the floor w/o having to hold on"    Currently in Pain?  No/denies    Pain Score  0-No pain                      [facility] Adult PT Treatment/Exercise - 09/02/17 1402      Exercises   Exercises  Knee/Hip      Knee/Hip Exercises: Stretches   ITB Stretch  Left;30 seconds;2 reps    ITB Stretch Limitations  standing at wall    Other Knee/Hip Stretches  roller stick to L ITB      Knee/Hip Exercises: Aerobic   Recumbent Bike  lvl 3 x 6'      Knee/Hip Exercises: Standing   Terminal Knee Extension  Right;Left;15 reps    Theraband Level (Terminal Knee Extension)  Level 4 (Blue)    Terminal Knee Extension  Limitations  emphasis on quad & hip extensor activation with heel press into floor    Lateral Step Up  Right;Left;10 reps;Step Height: 4";Hand Hold: 2    Lateral Step Up Limitations  UE support on counter; verbal cues to avoid locking knee into hyprextension & lifting from hip    Forward Step Up  Right;Left;10 reps;Step Height: 6";Hand Hold: 2    Forward Step Up Limitations  UE support on counter & back of chair; verbal & tactile cues to avoid locking knee into hyprextension & lifting from hip    Functional Squat  10 reps;3 seconds    Functional Squat Limitations  TRX squat with light glute touch to Airex pad in chair - intermittent L knee pain (alleviated with medial patellar glide)             PT Education - 09/02/17 1452    Education provided  Yes    Education Details  HEP update - standing ITB stretch & rollling to ITB    Person(s) Educated  Patient    Methods  Explanation;Demonstration;Handout    Comprehension  Verbalized understanding;Returned demonstration       PT Short Term Goals - 08/29/17 1423      PT SHORT TERM GOAL #1   Title  Complete initial assessment for flexibility & balance    Status  Achieved      PT SHORT TERM GOAL #2   Title  Independent with initial HEP    Status  Achieved      PT SHORT TERM GOAL #3   Title  B quad strength >/= 3/5 to reduce quad lag/instability    Status  On-going    Target Date  09/27/17      PT SHORT TERM GOAL #4   Title  Improve gait speed to >/= 2.0 ft/sec with LRAD as indicated to decrease risk for recurrent falls    Status  Achieved        PT Long Term Goals - 08/29/17 1423      PT LONG TERM GOAL #1   Title  Independent with ongoing HEP +/- gym program     Status  On-going    Target Date  09/27/17      PT LONG TERM GOAL #2   Title  B knee AROM 5-130 for normal gait mechanics    Status  Revised met for B knee flexion ROM   met for B knee flexion ROM   Target Date  09/27/17      PT LONG TERM GOAL #3   Title  B  hip and knee strength 4-/5 to 4/5 or greater for improved gait stability    Status  Partially Met    Target Date  09/27/17      PT LONG TERM GOAL #4   Title  Gait speed >/= 2.62 ft/sec with or w/o LRAD for improved safety with community ambulation    Status  Achieved            Plan - 09/02/17 1407    Clinical Impression Statement  Pt continues to fall back on old substitutions using hips to accomodate for quad weakness while attempting to lock knee into hyperextension, therefore targeted activities and exercises focusing on quad activation with PT providing verbal & tactile cues to avoid old tendencies. Good tolerance for most exercises except increased L knee pain with TRX squat, but able to alleviate pain with medial patellar glide. Increased tightness & ttp noted over distal L quads and ITB, therefore reviewed initial HEP ITB stretch and added standing stretch and rolling to promote increased flexibility.    Rehab Potential  Good    Clinical Impairments Affecting Rehab Potential  R patellar tendon repair in 2010 (limited rehab at the time due to only had Medicaid insurance coverage); frequent falls; osteoprosis    PT Treatment/Interventions  Patient/family education;Therapeutic exercise;Neuromuscular re-education;Balance training;Functional mobility training;Therapeutic activities;Gait training;Stair training;Manual techniques;Taping;Dry needling;Electrical Stimulation;Moist Heat;Traction;Cryotherapy;Vasopneumatic Device;Iontophoresis 28m/ml Dexamethasone;ADLs/Self Care Home Management    PT Next Visit Plan  LE flexibility; core stabilization & proximal LE strengthening - HEP update as indicated; manual therapy & modalities PRN for pain    Consulted and Agree with Plan of Care  Patient       Patient will benefit from skilled therapeutic intervention in order to improve the following deficits and impairments:  Pain, Decreased strength, Decreased range of motion, Impaired flexibility,  Abnormal gait, Difficulty walking, Decreased activity tolerance, Decreased balance  Visit Diagnosis: Acute pain of right knee  Acute pain of left knee  Acute bilateral low back pain with bilateral sciatica  Other abnormalities of gait and mobility  Difficulty in walking, not elsewhere classified  Repeated falls     Problem List Patient Active Problem List   Diagnosis Date Noted  . Acute left-sided low back pain with left-sided sciatica 06/25/2017  . Acute pain of both knees 06/25/2017  . Abnormal perimenopausal bleeding 09/30/2015  . Routine gynecological examination 08/06/2013    Percival Spanish, PT, MPT 09/02/2017, 3:01 PM  [facility] [address]  [place], [place], [address] Phone: [phone]   Fax:  [phone]  Name: LUVA METZGER MRN: [id number] Date of Birth: [birth year]

## 2017-09-05 ENCOUNTER — Ambulatory Visit: Payer: Medicare Other

## 2017-09-05 DIAGNOSIS — M25561 Pain in right knee: Secondary | ICD-10-CM | POA: Diagnosis not present

## 2017-09-05 DIAGNOSIS — M5441 Lumbago with sciatica, right side: Secondary | ICD-10-CM

## 2017-09-05 DIAGNOSIS — M25562 Pain in left knee: Secondary | ICD-10-CM

## 2017-09-05 DIAGNOSIS — M5442 Lumbago with sciatica, left side: Secondary | ICD-10-CM

## 2017-09-05 DIAGNOSIS — R296 Repeated falls: Secondary | ICD-10-CM

## 2017-09-05 DIAGNOSIS — R262 Difficulty in walking, not elsewhere classified: Secondary | ICD-10-CM

## 2017-09-05 DIAGNOSIS — R2689 Other abnormalities of gait and mobility: Secondary | ICD-10-CM

## 2017-09-05 NOTE — Therapy (Signed)
[facility] [address]  [place], [place], [address] Phone: [phone]   Fax:  [phone]  Physical Therapy Treatment  Patient Details  Name: Mikayla Moyer MRN: [id number] Date of Birth: Jun 04, 1956 Referring Provider: Rodell Perna, MD   Encounter Date: 09/05/2017  PT End of Session - 09/05/17 1415    Visit Number  15    Number of Visits  21    Date for PT Re-Evaluation  09/27/17    Authorization Type  [employer]HC Medicare & Medicaid    PT Start Time  3846 pt. arrived late     PT Stop Time  1511    PT Time Calculation (min)  60 min    Activity Tolerance  Patient tolerated treatment well    Behavior During Therapy  [facility] for tasks assessed/performed       Past Medical History:  Diagnosis Date  . Hyperlipidemia   . Hypertension   . Osteoporosis     Past Surgical History:  Procedure Laterality Date  . APPENDECTOMY    . KNEE SURGERY    . NASAL SINUS SURGERY    . TONSILLECTOMY      There were no vitals filed for this visit.  Subjective Assessment - 09/05/17 1416    Subjective  Pt. reporting she has had some L lateral knee pain the last two days without known trigger.  Reports this pain is worst while seated.      Patient Stated Goals  "Be able to walk w/o holding onto to things & pick things up off the floor w/o having to hold on"    Currently in Pain?  Yes    Pain Score  3     Pain Location  Knee    Pain Orientation  Left;Lateral    Pain Descriptors / Indicators  Pressure    Pain Type  Acute pain    Pain Onset  In the past 7 days    Pain Frequency  Constant    Aggravating Factors   moving, walking    Pain Relieving Factors  sitting    Multiple Pain Sites  No                      [facility] Adult PT Treatment/Exercise - 09/05/17 1516      Lumbar Exercises: Stretches   Quad Stretch  --    Sports administrator Limitations  --    ITB Stretch  --    ITB Stretch Limitations  --    Piriformis Stretch  --     Piriformis Stretch Limitations  --      Knee/Hip Exercises: Stretches   Passive Hamstring Stretch  Left;30 seconds;2 reps    Passive Hamstring Stretch Limitations  with therapist     Quad Stretch  Left;30 seconds    Quad Stretch Limitations  R sidelying with therapist     ITB Stretch  3 reps;30 seconds;Left    ITB Stretch Limitations  standing at wall; R sidelying, supine with strap     Piriformis Stretch  Left;2 reps;30 seconds    Piriformis Stretch Limitations  with therapist, mod piri, Figure-4     Other Knee/Hip Stretches  roller stick to L ITB x 1 min       Knee/Hip Exercises: Aerobic   Recumbent Bike  lvl 3 x 6'      Knee/Hip Exercises: Seated   Other Seated Knee/Hip Exercises  Sit<>stand from 14" box with airex  pad x 5 reps; 2 UE pushoff from nearby chair and mat table  terminated due to L lateral knee pain on lowering     Sit to Sand  15 reps;with UE support L manual medial patellar glide and relief throughout       Manual Therapy   Manual Therapy  Soft tissue mobilization;Myofascial release;Taping    Manual therapy comments  sitting and prone     Soft tissue mobilization  STM/strumming to L lateral quads/ITB/lateral HS; tenderness throughout; STM to L TFL     Myofascial Release  TPR to L distal/lateral HS, distal L VL     Kinesiotex  IT sales professional  Taping to L knee to encourage medial tracking of patella: 2 I-strips; lateral I-strip (30% stretch) inferior to superior knee (slight overlap on patella with medial pull); lateral I-strip (0% stretch) running inferior to superior; cross-strip running inferior lateral <>medial (30% stretch) crossing over tender area at lateral knee               PT Short Term Goals - 08/29/17 1423      PT SHORT TERM GOAL #1   Title  Complete initial assessment for flexibility & balance    Status  Achieved      PT SHORT TERM GOAL #2   Title  Independent with initial HEP    Status  Achieved      PT  SHORT TERM GOAL #3   Title  B quad strength >/= 3/5 to reduce quad lag/instability    Status  On-going    Target Date  09/27/17      PT SHORT TERM GOAL #4   Title  Improve gait speed to >/= 2.0 ft/sec with LRAD as indicated to decrease risk for recurrent falls    Status  Achieved        PT Long Term Goals - 08/29/17 1423      PT LONG TERM GOAL #1   Title  Independent with ongoing HEP +/- gym program     Status  On-going    Target Date  09/27/17      PT LONG TERM GOAL #2   Title  B knee AROM 5-130 for normal gait mechanics    Status  Revised met for B knee flexion ROM    Target Date  09/27/17      PT LONG TERM GOAL #3   Title  B hip and knee strength 4-/5 to 4/5 or greater for improved gait stability    Status  Partially Met    Target Date  09/27/17      PT LONG TERM GOAL #4   Title  Gait speed >/= 2.62 ft/sec with or w/o LRAD for improved safety with community ambulation    Status  Achieved            Plan - 09/05/17 1532    Clinical Impression Statement  Pt. with complaint to start treatment of L lateral knee pain over past two days which bothers her most while sitting and has limited her activity.  Pt. with complaint of L lateral knee pain with sit<>stand in treatment, which was relieved with manual medial patellar glide by therapist.  Manual STM/strumming performed to lateral thigh musculature with some relief.  L knee pain returning later in treatment limiting therex thus further stretching and trial of taping to L knee applied for hopeful improvement in patellar tracking and reduction in pain.  Will monitor response to taping in coming visits.    Clinical Impairments Affecting Rehab Potential  R patellar tendon repair in 2010 (limited rehab at the time due to only had Medicaid insurance coverage); frequent falls; osteoprosis    PT Treatment/Interventions  Patient/family education;Therapeutic exercise;Neuromuscular re-education;Balance training;Functional mobility  training;Therapeutic activities;Gait training;Stair training;Manual techniques;Taping;Dry needling;Electrical Stimulation;Moist Heat;Traction;Cryotherapy;Vasopneumatic Device;Iontophoresis 25m/ml Dexamethasone;ADLs/Self Care Home Management    PT Next Visit Plan  LE flexibility; core stabilization & proximal LE strengthening - HEP update as indicated; manual therapy & modalities PRN for pain       Patient will benefit from skilled therapeutic intervention in order to improve the following deficits and impairments:  Pain, Decreased strength, Decreased range of motion, Impaired flexibility, Abnormal gait, Difficulty walking, Decreased activity tolerance, Decreased balance  Visit Diagnosis: Acute pain of right knee  Acute pain of left knee  Acute bilateral low back pain with bilateral sciatica  Other abnormalities of gait and mobility  Difficulty in walking, not elsewhere classified  Repeated falls     Problem List Patient Active Problem List   Diagnosis Date Noted  . Acute left-sided low back pain with left-sided sciatica 06/25/2017  . Acute pain of both knees 06/25/2017  . Abnormal perimenopausal bleeding 09/30/2015  . Routine gynecological examination 08/06/2013    MBess Harvest PTA 09/05/17 8:59 PM  [facility] [place] [address]Phone: [phone]  Fax:  [phone] Name: LREX MAGEEMRN: [id number] of Birth: [birth year]

## 2017-09-09 ENCOUNTER — Encounter: Payer: Self-pay | Admitting: Physical Therapy

## 2017-09-09 ENCOUNTER — Ambulatory Visit: Payer: Medicare Other | Admitting: Physical Therapy

## 2017-09-09 DIAGNOSIS — R2689 Other abnormalities of gait and mobility: Secondary | ICD-10-CM

## 2017-09-09 DIAGNOSIS — R262 Difficulty in walking, not elsewhere classified: Secondary | ICD-10-CM

## 2017-09-09 DIAGNOSIS — M5441 Lumbago with sciatica, right side: Secondary | ICD-10-CM

## 2017-09-09 DIAGNOSIS — M25561 Pain in right knee: Secondary | ICD-10-CM

## 2017-09-09 DIAGNOSIS — M25562 Pain in left knee: Secondary | ICD-10-CM

## 2017-09-09 DIAGNOSIS — R296 Repeated falls: Secondary | ICD-10-CM

## 2017-09-09 DIAGNOSIS — M5442 Lumbago with sciatica, left side: Secondary | ICD-10-CM

## 2017-09-09 NOTE — Therapy (Signed)
[facility] [address]  [place], [place], [address] Phone: [phone]   Fax:  [phone]  Physical Therapy Treatment  Patient Details  Name: Mikayla Moyer MRN: [id number] Date of Birth: 1956/08/06 Referring Provider: Rodell Perna, MD   Encounter Date: 09/09/2017  PT End of Session - 09/09/17 1309    Visit Number  16    Number of Visits  21    Date for PT Re-Evaluation  09/27/17    Authorization Type  [employer] Medicare & Medicaid    PT Start Time  1309    PT Stop Time  1359    PT Time Calculation (min)  50 min    Activity Tolerance  Patient tolerated treatment well    Behavior During Therapy  [facility] for tasks assessed/performed       Past Medical History:  Diagnosis Date  . Hyperlipidemia   . Hypertension   . Osteoporosis     Past Surgical History:  Procedure Laterality Date  . APPENDECTOMY    . KNEE SURGERY    . NASAL SINUS SURGERY    . TONSILLECTOMY      There were no vitals filed for this visit.  Subjective Assessment - 09/09/17 1311    Subjective  Pt reports L lateral knee is still a little sore but not like the pain from last visit.    Pertinent History  R patellar tendon repair in 2010 - limited rehab at the time due to only had Medicaid insurance coverage    Patient Stated Goals  "Be able to walk w/o holding onto to things & pick things up off the floor w/o having to hold on"    Currently in Pain?  Yes    Pain Score  3     Pain Location  Knee    Pain Orientation  Left;Lateral    Pain Descriptors / Indicators  Sore    Pain Type  Acute pain                      [facility] Adult PT Treatment/Exercise - 09/09/17 1309      Exercises   Exercises  Knee/Hip      Knee/Hip Exercises: Stretches   ITB Stretch  Left;30 seconds;2 reps    ITB Stretch Limitations  standing at wall    Other Knee/Hip Stretches  foam rolling on mat table to L ITB      Knee/Hip Exercises: Aerobic   Recumbent  Bike  lvl 3 x 6'      Knee/Hip Exercises: Standing   Hip Flexion  Both;10 reps;Knee straight;Stengthening    Hip Flexion Limitations  red TB, UE support on back of chair; cues for abd bracing and quad set to maintain knee extension    Terminal Knee Extension  Right;Left;15 reps    Theraband Level (Terminal Knee Extension)  Level 4 (Blue)    Terminal Knee Extension Limitations  emphasis on quad & hip extensor activation with heel press into floor    Hip ADduction  Both;10 reps;Strengthening    Hip ADduction Limitations  red TB, UE support on back of chair    Hip Abduction  Both;10 reps;Knee straight;Stengthening    Abduction Limitations  red TB, UE support on back of chair    Hip Extension  Both;10 reps;Knee straight;Stengthening    Extension Limitations  red TB, UE support on back of chair      Manual Therapy   Manual Therapy  Soft tissue mobilization;Myofascial release;Taping    Soft tissue mobilization  STM/strumming & IASTM with roller stick to L ITB & lateral HS    Myofascial Release  TPR to L distal/lateral HS    Kinesiotex  Create Space      Kinesiotix   Create Space  L ITB at 30% with 3 50% strips at areas at greatest tenderness               PT Short Term Goals - 08/29/17 1423      PT SHORT TERM GOAL #1   Title  Complete initial assessment for flexibility & balance    Status  Achieved      PT SHORT TERM GOAL #2   Title  Independent with initial HEP    Status  Achieved      PT SHORT TERM GOAL #3   Title  B quad strength >/= 3/5 to reduce quad lag/instability    Status  On-going    Target Date  09/27/17      PT SHORT TERM GOAL #4   Title  Improve gait speed to >/= 2.0 ft/sec with LRAD as indicated to decrease risk for recurrent falls    Status  Achieved        PT Long Term Goals - 08/29/17 1423      PT LONG TERM GOAL #1   Title  Independent with ongoing HEP +/- gym program     Status  On-going    Target Date  09/27/17      PT LONG TERM GOAL #2    Title  B knee AROM 5-130 for normal gait mechanics    Status  Revised met for B knee flexion ROM    Target Date  09/27/17      PT LONG TERM GOAL #3   Title  B hip and knee strength 4-/5 to 4/5 or greater for improved gait stability    Status  Partially Met    Target Date  09/27/17      PT LONG TERM GOAL #4   Title  Gait speed >/= 2.62 ft/sec with or w/o LRAD for improved safety with community ambulation    Status  Achieved            Plan - 09/09/17 1314    Clinical Impression Statement  Pt reporting taping only lasted 1 day but some relief noted while present and pain somewhat less today (although pain rating unchanged). Continued tightness with taut bands and TPs present in L distal lateral hamstrings, therefore targeted manual therapy and stretching to this area followed by taping to L ITB with cross-strips at areas of greatest tenderness. Continued focus on hip and quad strengthening with good tolerance.    Rehab Potential  Good    Clinical Impairments Affecting Rehab Potential  R patellar tendon repair in 2010 (limited rehab at the time due to only had Medicaid insurance coverage); frequent falls; osteoprosis    PT Treatment/Interventions  Patient/family education;Therapeutic exercise;Neuromuscular re-education;Balance training;Functional mobility training;Therapeutic activities;Gait training;Stair training;Manual techniques;Taping;Dry needling;Electrical Stimulation;Moist Heat;Traction;Cryotherapy;Vasopneumatic Device;Iontophoresis 50m/ml Dexamethasone;ADLs/Self Care Home Management    PT Next Visit Plan  LE flexibility; core stabilization & proximal LE strengthening - HEP update as indicated; manual therapy & modalities PRN for pain    Consulted and Agree with Plan of Care  Patient       Patient will benefit from skilled therapeutic intervention in order to improve the following deficits and impairments:  Pain, Decreased strength, Decreased range of motion,  Impaired flexibility,  Abnormal gait, Difficulty walking, Decreased activity tolerance, Decreased balance  Visit Diagnosis: Acute pain of right knee  Acute pain of left knee  Acute bilateral low back pain with bilateral sciatica  Other abnormalities of gait and mobility  Difficulty in walking, not elsewhere classified  Repeated falls     Problem List Patient Active Problem List   Diagnosis Date Noted  . Acute left-sided low back pain with left-sided sciatica 06/25/2017  . Acute pain of both knees 06/25/2017  . Abnormal perimenopausal bleeding 09/30/2015  . Routine gynecological examination 08/06/2013    Percival Spanish, PT, MPT 09/09/2017, 2:12 PM  [facility] [address]  [place], [place], [address] Phone: [phone]   Fax:  [phone]  Name: Mikayla Moyer MRN: [id number] Date of Birth: 1956/10/01

## 2017-09-16 ENCOUNTER — Ambulatory Visit: Payer: Medicare Other | Admitting: Physical Therapy

## 2017-09-16 ENCOUNTER — Encounter: Payer: Self-pay | Admitting: Physical Therapy

## 2017-09-16 DIAGNOSIS — M25561 Pain in right knee: Secondary | ICD-10-CM

## 2017-09-16 DIAGNOSIS — R296 Repeated falls: Secondary | ICD-10-CM

## 2017-09-16 DIAGNOSIS — R262 Difficulty in walking, not elsewhere classified: Secondary | ICD-10-CM

## 2017-09-16 DIAGNOSIS — M25562 Pain in left knee: Secondary | ICD-10-CM

## 2017-09-16 DIAGNOSIS — M5441 Lumbago with sciatica, right side: Secondary | ICD-10-CM

## 2017-09-16 DIAGNOSIS — M5442 Lumbago with sciatica, left side: Secondary | ICD-10-CM

## 2017-09-16 DIAGNOSIS — R2689 Other abnormalities of gait and mobility: Secondary | ICD-10-CM

## 2017-09-16 NOTE — Therapy (Signed)
[facility] [address]  [place], [place], [address] Phone: [phone]   Fax:  [phone]  Physical Therapy Treatment  Patient Details  Name: Mikayla Moyer MRN: [id number] Date of Birth: Mar 11, 1956 Referring Provider: Rodell Perna, MD   Encounter Date: 09/16/2017  PT End of Session - 09/16/17 1306    Visit Number  17    Number of Visits  21    Date for PT Re-Evaluation  09/27/17    Authorization Type  [employer] Medicare & Medicaid    PT Start Time  1306    PT Stop Time  1353    PT Time Calculation (min)  47 min    Activity Tolerance  Patient tolerated treatment well    Behavior During Therapy  [facility] for tasks assessed/performed       Past Medical History:  Diagnosis Date  . Hyperlipidemia   . Hypertension   . Osteoporosis     Past Surgical History:  Procedure Laterality Date  . APPENDECTOMY    . KNEE SURGERY    . NASAL SINUS SURGERY    . TONSILLECTOMY      There were no vitals filed for this visit.  Subjective Assessment - 09/16/17 1308    Subjective  Pt doing well today. Noting benefit from taping latst visit.    Pertinent History  R patellar tendon repair in 2010 - limited rehab at the time due to only had Medicaid insurance coverage    Patient Stated Goals  "Be able to walk w/o holding onto to things & pick things up off the floor w/o having to hold on"    Currently in Pain?  No/denies    Pain Score  0-No pain                      [facility] Adult PT Treatment/Exercise - 09/16/17 1306      Exercises   Exercises  Knee/Hip      Knee/Hip Exercises: Aerobic   Recumbent Bike  lvl 3 x 6'      Knee/Hip Exercises: Machines for Strengthening   Cybex Knee Extension  B LE 5# x10    Cybex Knee Flexion  B LE 25# x15; B con/alt ecc 20# x10 each    Cybex Leg Press  B LE 25# x15; B ankle press (avoiding knee hyperextension) 25# x15      Knee/Hip Exercises: Standing   Heel Raises  Both;10 reps;3  seconds;2 sets    Heel Raises Limitations  1 set each with R/L single leg eccentric lowering    Step Down  Both;10 reps;Step Height: 4";Hand Hold: 2    Step Down Limitations  eccentric lowering    Functional Squat  10 reps;3 seconds;2 sets    Functional Squat Limitations  TRX squat with light glute touch to Airex pad in chair for 1st set, glute touch to chair w/o Airex pad for 2nd set    Wall Squat  15 reps;5 seconds    Wall Squat Limitations  + hip adduction ball squeeze; PT spotting/guarding at knees, but no buckling evident - cues for even weight shift (tendency for increased wt shift to L)    Other Standing Knee Exercises  B sidestepping with looped red TB at ankles 3 x 10 ft at edge of counter               PT Short Term Goals - 08/29/17 1423  PT SHORT TERM GOAL #1   Title  Complete initial assessment for flexibility & balance    Status  Achieved      PT SHORT TERM GOAL #2   Title  Independent with initial HEP    Status  Achieved      PT SHORT TERM GOAL #3   Title  B quad strength >/= 3/5 to reduce quad lag/instability    Status  On-going    Target Date  09/27/17      PT SHORT TERM GOAL #4   Title  Improve gait speed to >/= 2.0 ft/sec with LRAD as indicated to decrease risk for recurrent falls    Status  Achieved        PT Long Term Goals - 08/29/17 1423      PT LONG TERM GOAL #1   Title  Independent with ongoing HEP +/- gym program     Status  On-going    Target Date  09/27/17      PT LONG TERM GOAL #2   Title  B knee AROM 5-130 for normal gait mechanics    Status  Revised met for B knee flexion ROM    Target Date  09/27/17      PT LONG TERM GOAL #3   Title  B hip and knee strength 4-/5 to 4/5 or greater for improved gait stability    Status  Partially Met    Target Date  09/27/17      PT LONG TERM GOAL #4   Title  Gait speed >/= 2.62 ft/sec with or w/o LRAD for improved safety with community ambulation    Status  Achieved             Plan - 09/16/17 1309    Clinical Impression Statement  Pt noting benefit from taping with no pain reported today. Able to progress LE strengthening with increasing resistance and increased ROM with functional activities such as squats with fatigue noted but no increased pain. Pt continues to lack control of TKE in LAQ motions but gradually improving. Pt continues to have good potential to further benefit from skilled PT for further strengthing and balance training to improve functional mobility and decrease risk for falls.    Rehab Potential  Good    Clinical Impairments Affecting Rehab Potential  R patellar tendon repair in 2010 (limited rehab at the time due to only had Medicaid insurance coverage); frequent falls; osteoprosis    PT Treatment/Interventions  Patient/family education;Therapeutic exercise;Neuromuscular re-education;Balance training;Functional mobility training;Therapeutic activities;Gait training;Stair training;Manual techniques;Taping;Dry needling;Electrical Stimulation;Moist Heat;Traction;Cryotherapy;Vasopneumatic Device;Iontophoresis 27m/ml Dexamethasone;ADLs/Self Care Home Management    PT Next Visit Plan  LE flexibility; core stabilization & proximal LE strengthening - HEP update as indicated; manual therapy & modalities PRN for pain    Consulted and Agree with Plan of Care  Patient       Patient will benefit from skilled therapeutic intervention in order to improve the following deficits and impairments:  Pain, Decreased strength, Decreased range of motion, Impaired flexibility, Abnormal gait, Difficulty walking, Decreased activity tolerance, Decreased balance  Visit Diagnosis: Acute pain of right knee  Acute pain of left knee  Acute bilateral low back pain with bilateral sciatica  Other abnormalities of gait and mobility  Difficulty in walking, not elsewhere classified  Repeated falls     Problem List Patient Active Problem List   Diagnosis Date  Noted  . Acute left-sided low back pain with left-sided sciatica 06/25/2017  . Acute pain of both  knees 06/25/2017  . Abnormal perimenopausal bleeding 09/30/2015  . Routine gynecological examination 08/06/2013    Percival Spanish, PT, MPT 09/16/2017, 2:00 PM  [facility] [address]  [place], [place], [address] Phone: [phone]   Fax:  [phone]  Name: Mikayla Moyer MRN: [id number] Date of Birth: 05/01/1956

## 2017-09-18 ENCOUNTER — Ambulatory Visit: Payer: Medicare Other

## 2017-09-18 DIAGNOSIS — R2689 Other abnormalities of gait and mobility: Secondary | ICD-10-CM

## 2017-09-18 DIAGNOSIS — M5441 Lumbago with sciatica, right side: Secondary | ICD-10-CM

## 2017-09-18 DIAGNOSIS — R262 Difficulty in walking, not elsewhere classified: Secondary | ICD-10-CM

## 2017-09-18 DIAGNOSIS — M25561 Pain in right knee: Secondary | ICD-10-CM | POA: Diagnosis not present

## 2017-09-18 DIAGNOSIS — R296 Repeated falls: Secondary | ICD-10-CM

## 2017-09-18 DIAGNOSIS — M5442 Lumbago with sciatica, left side: Secondary | ICD-10-CM

## 2017-09-18 DIAGNOSIS — M25562 Pain in left knee: Secondary | ICD-10-CM

## 2017-09-18 NOTE — Therapy (Signed)
[facility] [address]  [place], [place], [address] Phone: [phone]   Fax:  [phone]  Physical Therapy Treatment  Patient Details  Name: Mikayla Moyer MRN: [id number] Date of Birth: [birth year] Referring Provider: Rodell Perna, MD   Encounter Date: 09/18/2017  PT End of Session - 09/18/17 1441    Visit Number  18    Number of Visits  21    Date for PT Re-Evaluation  09/27/17    Authorization Type  [employer] Medicare & Medicaid    PT Start Time  1402    PT Stop Time  1446    PT Time Calculation (min)  44 min    Activity Tolerance  Patient tolerated treatment well    Behavior During Therapy  [facility] for tasks assessed/performed       Past Medical History:  Diagnosis Date  . Hyperlipidemia   . Hypertension   . Osteoporosis     Past Surgical History:  Procedure Laterality Date  . APPENDECTOMY    . KNEE SURGERY    . NASAL SINUS SURGERY    . TONSILLECTOMY      There were no vitals filed for this visit.  Subjective Assessment - 09/18/17 1600    Subjective  Reports she no longer has LBP when bending down to pick things up from floor however still requires UE assist on furniture.      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    Patient Stated Goals  "Be able to walk w/o holding onto to things & pick things up off the floor w/o having to hold on"    Currently in Pain?  No/denies    Pain Score  0-No pain    Multiple Pain Sites  No                      [facility] Adult PT Treatment/Exercise - 09/18/17 1422      Knee/Hip Exercises: Aerobic   Recumbent Bike  lvl 3 x 6'      Knee/Hip Exercises: Standing   Heel Raises  Both;15 reps;1  second;1 set added to HEP    Heel Raises Limitations  B con/ecc    Knee Flexion  Right;Left;10 reps;2 sets    Knee Flexion Limitations  2# on 1st set; red TB at ankles on 2nd set     Step Down  Right;Left;15 reps;Hand Hold: 2;Step Height: 4"    Step Down Limitations  eccentric lowering tactile cueing to prevent pt. from "locking out knees"    Wall Squat  15 reps;5 seconds    Wall Squat Limitations  cues for even wt. shift; therapist manual guard on R quad to prevent knee buckling - no buckling     Other Standing Knee Exercises  forward/backwards monster walk with red TB at counter 4 x 10 ft each way       Knee/Hip Exercises: Seated   Other Seated Knee/Hip Exercises  R/L Fitter leg press (1 blue band, 1 black band) x 20 each    Sit to Sand  10 reps;with UE support 1 UE pushoff from knee from chair with armrests  PT Education - 09/18/17 1455    Education provided  Yes    Education Details  heel raise, HS curl with red TB on forefoot (pt. already has looped red TB     Person(s) Educated  Patient    Methods  Explanation;Demonstration;Verbal cues;Handout    Comprehension  Verbalized understanding;Returned demonstration;Verbal cues required;Need further instruction       PT Short Term Goals - 08/29/17 1423      PT SHORT TERM GOAL #1   Title  Complete initial assessment for flexibility & balance    Status  Achieved      PT SHORT TERM GOAL #2   Title  Independent with initial HEP    Status  Achieved      PT SHORT TERM GOAL #3   Title  B quad strength >/= 3/5 to reduce quad lag/instability    Status  On-going    Target Date  09/27/17      PT SHORT TERM GOAL #4   Title  Improve gait speed to >/= 2.0 ft/sec with LRAD as indicated to decrease risk for recurrent falls    Status  Achieved        PT Long Term Goals - 08/29/17 1423      PT LONG TERM GOAL #1   Title  Independent with ongoing HEP +/- gym program     Status  On-going    Target Date  09/27/17       PT LONG TERM GOAL #2   Title  B knee AROM 5-130 for normal gait mechanics    Status  Revised met for B knee flexion ROM    Target Date  09/27/17      PT LONG TERM GOAL #3   Title  B hip and knee strength 4-/5 to 4/5 or greater for improved gait stability    Status  Partially Met    Target Date  09/27/17      PT LONG TERM GOAL #4   Title  Gait speed >/= 2.62 ft/sec with or w/o LRAD for improved safety with community ambulation    Status  Achieved            Plan - 09/18/17 1548    Clinical Impression Statement  [place] doing well today reporting she feels she is getting stronger with therapy.  Able to progress to "deeper" wall sit and tolerated addition of forward/backward monster walk without issue today.  Demonstrating good stability with sit<> stand with 1 UE push off from knees today and reports she can bend down to pick up object from floor without back pain now.  Progressing well toward goals.      Clinical Impairments Affecting Rehab Potential  R patellar tendon repair in 2010 (limited rehab at the time due to only had Medicaid insurance coverage); frequent falls; osteoprosis    PT Treatment/Interventions  Patient/family education;Therapeutic exercise;Neuromuscular re-education;Balance training;Functional mobility training;Therapeutic activities;Gait training;Stair training;Manual techniques;Taping;Dry needling;Electrical Stimulation;Moist Heat;Traction;Cryotherapy;Vasopneumatic Device;Iontophoresis 41m/ml Dexamethasone;ADLs/Self Care Home Management    PT Next Visit Plan  LE flexibility; core stabilization & proximal LE strengthening - HEP update as indicated; manual therapy & modalities PRN for pain       Patient will benefit from skilled therapeutic intervention in order to improve the following deficits and impairments:  Pain, Decreased strength, Decreased range of motion, Impaired flexibility, Abnormal gait, Difficulty walking, Decreased activity tolerance, Decreased  balance  Visit Diagnosis: Acute pain of right knee  Acute pain of left knee  Acute bilateral low  back pain with bilateral sciatica  Other abnormalities of gait and mobility  Difficulty in walking, not elsewhere classified  Repeated falls     Problem List Patient Active Problem List   Diagnosis Date Noted  . Acute left-sided low back pain with left-sided sciatica 06/25/2017  . Acute pain of both knees 06/25/2017  . Abnormal perimenopausal bleeding 09/30/2015  . Routine gynecological examination 08/06/2013    Bess Harvest, PTA 09/18/17 4:03 PM  [facility] [address]  [place], [place], [address] Phone: [phone]   Fax:  [phone]  Name: RENUKA FARFAN MRN: [id number] Date of Birth: 18-Apr-1956

## 2017-09-23 ENCOUNTER — Ambulatory Visit: Payer: Medicare Other | Admitting: Physical Therapy

## 2017-09-23 ENCOUNTER — Encounter: Payer: Self-pay | Admitting: Physical Therapy

## 2017-09-23 DIAGNOSIS — M5442 Lumbago with sciatica, left side: Secondary | ICD-10-CM

## 2017-09-23 DIAGNOSIS — R2689 Other abnormalities of gait and mobility: Secondary | ICD-10-CM

## 2017-09-23 DIAGNOSIS — M5441 Lumbago with sciatica, right side: Secondary | ICD-10-CM

## 2017-09-23 DIAGNOSIS — M25561 Pain in right knee: Secondary | ICD-10-CM | POA: Diagnosis not present

## 2017-09-23 DIAGNOSIS — M25562 Pain in left knee: Secondary | ICD-10-CM

## 2017-09-23 DIAGNOSIS — R296 Repeated falls: Secondary | ICD-10-CM

## 2017-09-23 DIAGNOSIS — R262 Difficulty in walking, not elsewhere classified: Secondary | ICD-10-CM

## 2017-09-23 NOTE — Therapy (Signed)
[facility] [address]  [place], [place], [address] Phone: [phone]   Fax:  [phone]  Physical Therapy Treatment  Patient Details  Name: Mikayla Moyer MRN: [id number] Date of Birth: [birth year] Referring Provider: Rodell Perna, MD   Encounter Date: 09/23/2017  PT End of Session - 09/23/17 1310    Visit Number  19    Number of Visits  28    Date for PT Re-Evaluation  10/25/17    Authorization Type  [employer]HC Medicare & Medicaid (follows Medicare guidelines - KX modifier as of 09/28/17)    PT Start Time  1310    PT Stop Time  1352    PT Time Calculation (min)  42 min    Activity Tolerance  Patient tolerated treatment well    Behavior During Therapy  [facility] for tasks assessed/performed       Past Medical History:  Diagnosis Date  . Hyperlipidemia   . Hypertension   . Osteoporosis     Past Surgical History:  Procedure Laterality Date  . APPENDECTOMY    . KNEE SURGERY    . NASAL SINUS SURGERY    . TONSILLECTOMY      There were no vitals filed for this visit.  Subjective Assessment - 09/23/17 1312    Subjective  Pt doing well today with no new complaints. Reports she is able to stand in one spot for longer periods w/o knee pain.    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    Patient Stated Goals  "Be able to walk w/o holding onto to things & pick things up off the floor w/o having to hold on"    Currently in Pain?  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)      AROM   Right Knee Extension  30    Right Knee Flexion  140    Left Knee Extension  18    Left Knee Flexion  141      Strength   Right Hip Flexion  4+/5    Right Hip Extension  4/5    Right Hip External Rotation   4/5    Right Hip Internal Rotation  4+/5    Right Hip ABduction  4/5    Right Hip ADduction  4-/5    Left Hip Flexion  4+/5    Left Hip Extension  4/5    Left Hip External Rotation  4/5    Left Hip Internal Rotation  4+/5    Left Hip ABduction  4+/5    Left Hip ADduction  4-/5    Right Knee Flexion  4/5    Right Knee Extension  3-/5    Left Knee Flexion  4+/5    Left Knee Extension  3-/5      Ambulation/Gait   Gait Pattern  Step-through pattern;Wide base of support    Gait velocity  2.74 ft/sec      Standardized Balance Assessment   10 Meter Walk  11.97"                  [facility] Adult PT Treatment/Exercise - 09/23/17 1310      Exercises   Exercises  Knee/Hip      Knee/Hip Exercises: Aerobic   Recumbent Bike  lvl 3 x 6'      Knee/Hip Exercises: Standing   Forward Step Up  Both;10 reps;2 sets;Step Height: 4";Step Height: 6";Hand Hold: 1;Hand Hold: 2    Forward Step Up Limitations  + blue TB TKE; single HHA on counter, intermittent 2nd UE assist      Knee/Hip Exercises: Supine   Short Arc Quad Sets  Both;AAROM;10 reps    Short Arc Quad Sets Limitations  + hip adduction ball squeeze    Other Supine Knee/Hip Exercises  straight leg bridge + alt SLR x10    Other Supine Knee/Hip Exercises  bridge + HS curl with heels on penaut ball x10               PT Short Term Goals - 08/29/17 1423      PT SHORT TERM GOAL #1   Title  Complete initial assessment for flexibility & balance    Status  Achieved       PT SHORT TERM GOAL #2   Title  Independent with initial HEP    Status  Achieved      PT SHORT TERM GOAL #3   Title  B quad strength >/= 3/5 to reduce quad lag/instability    Status  On-going    Target Date  09/27/17      PT SHORT TERM GOAL #4   Title  Improve gait speed to >/= 2.0 ft/sec with LRAD as indicated to decrease risk for recurrent falls    Status  Achieved        PT Long Term Goals - 09/23/17 1326      PT LONG TERM GOAL #1   Title  Independent with ongoing HEP +/- gym program     Status  Partially Met met for current HEP    Target Date  10/25/17      PT LONG TERM GOAL #2   Title  B knee AROM 5-130 for normal gait mechanics    Status  Partially Met met for B knee flexion ROM    Target Date  10/25/17      PT LONG TERM GOAL #3   Title  B hip and knee strength 4-/5 to 4/5 or greater for improved gait stability    Status  Partially Met met except B knee extension    Target Date  10/25/17      PT LONG TERM GOAL #4   Title  Gait speed >/= 2.62 ft/sec with or w/o LRAD for improved safety with community ambulation    Status  Achieved            Plan - 09/23/17 1315    Clinical Impression Statement  Priscila continues to progress with PT with pt reporting no recent back pain and stating improving standing tolerance w/o limitaiton due  to knee pain. B LE strength continues to improve with LTG met for all muscles except quads due continued presence of significant quad lag. Full ROM available w/o pain in B knees, lacking only quad control for TKE against gravity. Gait speed goal now met with pt reporting no recent incidences of falls. Pt reporting daily completion of HEP and denies any issues with HEP. Given continued improvment with PT, yet goals not yet fully met primarily due to persistent quad weakness/lack of control, recommend continued PT for additonal 4 weeks at 2x/wk after this week.    Clinical Impairments Affecting Rehab Potential  R patellar tendon repair in  2010 (limited rehab at the time due to only had Medicaid insurance coverage); frequent falls; osteoprosis    PT Frequency  2x / week    PT Duration  4 weeks    PT Treatment/Interventions  Patient/family education;Therapeutic exercise;Neuromuscular re-education;Balance training;Functional mobility training;Therapeutic activities;Gait training;Stair training;Manual techniques;Taping;Dry needling;Electrical Stimulation;Moist Heat;Traction;Cryotherapy;Vasopneumatic Device;Iontophoresis 28m/ml Dexamethasone;ADLs/Self Care Home Management    PT Next Visit Plan  LE flexibility; core stabilization & proximal LE strengthening - HEP update as indicated; manual therapy & modalities PRN for pain       Patient will benefit from skilled therapeutic intervention in order to improve the following deficits and impairments:  Pain, Decreased strength, Decreased range of motion, Impaired flexibility, Abnormal gait, Difficulty walking, Decreased activity tolerance, Decreased balance  Visit Diagnosis: Acute pain of right knee  Acute pain of left knee  Acute bilateral low back pain with bilateral sciatica  Other abnormalities of gait and mobility  Difficulty in walking, not elsewhere classified  Repeated falls     Problem List Patient Active Problem List   Diagnosis Date Noted  . Acute left-sided low back pain with left-sided sciatica 06/25/2017  . Acute pain of both knees 06/25/2017  . Abnormal perimenopausal bleeding 09/30/2015  . Routine gynecological examination 08/06/2013    JPercival Spanish11/26/2018, 7:54 PM  [facility] [place] [address]Phone: [phone]  Fax:  [phone] Name: LCORIANN BROUHARDMRN: [id number] of Birth: 3June 16, 1957

## 2017-09-24 ENCOUNTER — Ambulatory Visit (INDEPENDENT_AMBULATORY_CARE_PROVIDER_SITE_OTHER): Payer: Medicare Other | Admitting: Orthopaedic Surgery

## 2017-09-24 ENCOUNTER — Encounter (INDEPENDENT_AMBULATORY_CARE_PROVIDER_SITE_OTHER): Payer: Self-pay | Admitting: Orthopaedic Surgery

## 2017-09-24 VITALS — BP 184/102 | HR 78 | Ht 65.0 in | Wt 281.0 lb

## 2017-09-24 DIAGNOSIS — M1712 Unilateral primary osteoarthritis, left knee: Secondary | ICD-10-CM

## 2017-09-24 DIAGNOSIS — M1711 Unilateral primary osteoarthritis, right knee: Secondary | ICD-10-CM | POA: Diagnosis not present

## 2017-09-24 DIAGNOSIS — M7062 Trochanteric bursitis, left hip: Secondary | ICD-10-CM | POA: Diagnosis not present

## 2017-09-24 MED ORDER — LIDOCAINE HCL 1 % IJ SOLN
3.0000 mL | INTRAMUSCULAR | Status: AC | PRN
  Administered 2017-09-24: 3 mL

## 2017-09-24 MED ORDER — BUPIVACAINE HCL 0.25 % IJ SOLN
6.0000 mL | INTRAMUSCULAR | Status: AC | PRN
Start: 2017-09-24 — End: 2017-09-24
  Administered 2017-09-24: 6 mL via INTRA_ARTICULAR

## 2017-09-24 MED ORDER — METHYLPREDNISOLONE ACETATE 40 MG/ML IJ SUSP
80.0000 mg | INTRAMUSCULAR | Status: AC | PRN
  Administered 2017-09-24: 80 mg

## 2017-09-24 NOTE — Progress Notes (Signed)
Office Visit Note   Patient: Mikayla ChristenLucille M Moyer           Date of Birth: 1956/02/06           MRN: [id number] Visit Date: 09/24/2017              Requested by: Fleet ContrasAvbuere, Edwin, MD [address] [place], [place] [id number] PCP: Fleet ContrasAvbuere, Edwin, MD   Assessment & Plan: Visit Diagnoses:  1. Arthritis of left knee   2. Arthritis of right knee   3. Trochanteric bursitis, left hip     Plan: In hopes to give patient some relief of bilateral knee pain offered injections. Patient sent bilateral knees were prepped with Betadine and intra-articular Marcaine/Depo-Medrol 6-2 injections were performed. Tolerated without complications. At the sitting for a few minutes patient did have good relief with Marcaine in place. Patient was given IT band stretching exercises as well. Follow-up in 6 weeks for recheck. If knees continue to be symptomatic we may consider trying visco supplementation.  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  Follow-Up Instructions: Return in about 6 weeks (around 11/05/2017).   Orders:  No orders of the defined types were placed in this encounter.  No orders of the defined types were placed in this encounter.     Procedures: Large Joint Inj: R knee on 09/24/2017 11:47 AM Indications: joint swelling and pain Details: 25 G 1.5 in needle, anteromedial approach  Arthrogram: No  Medications: 3 mL lidocaine 1 %; 6 mL bupivacaine 0.25 %; 80 mg methylPREDNISolone acetate 40 MG/ML Outcome: tolerated well, no immediate complications Consent was given by the patient.   Large Joint Inj: L knee on 09/24/2017 11:48 AM Indications: pain and joint swelling Details: 25 G 1.5 in needle, anteromedial approach  Arthrogram: No  Medications: 3 mL lidocaine 1 %; 6 mL bupivacaine 0.25 %; 80 mg methylPREDNISolone acetate  40 MG/ML Consent was given by the patient.       Clinical Data: No additional findings.   Subjective: Chief Complaint  Patient presents with  . Follow-up    bilateral leg weaknessq    HPI Patient with history of bilateral knee DJD returns for recheck. She has been going to formal physical therapy and states that she is getting somewhat better. She does continue to complain of bilateral knee pain, swelling and stiffness. No complaints today of left lateral hip pain that extends down the course of her IT band. This is been ongoing for several weeks. She states that the side of her thigh gets tight. Review of Systems No current cardiopulmonary GI GU issues.  Objective: Vital Signs: BP (!) 184/102   Pulse 78   Ht 5\' 5"  (1.651 m)   Wt 281 lb (127.5 kg)   BMI 46.76 kg/m   Physical Exam  Constitutional: She is oriented to person, place, and time. No distress.  HENT:  Head: Normocephalic and atraumatic.  Eyes: EOM are normal. Pupils are equal, round, and reactive to light.  Pulmonary/Chest: No respiratory distress.  Abdominal: She exhibits no distension.  Musculoskeletal:  Gait is antalgic. She is mild to moderately tender over the left hip greater trochanter bursa. Some tenderness also down the IT band. Bilateral positive patellofemoral crepitus.. Bilateral joint line tenderness.  Negative logroll bilateral hips. Negative straight leg raise  Neurological: She is alert and oriented to  person, place, and time.  Psychiatric: She has a normal mood and affect.    Ortho Exam  Specialty Comments:  No specialty comments available.  Imaging: No results found.   PMFS History: Patient Active Problem List   Diagnosis Date Noted  . Acute left-sided low back pain with left-sided sciatica 06/25/2017  . Acute pain of both knees 06/25/2017  . Abnormal perimenopausal bleeding 09/30/2015  . Routine gynecological examination 08/06/2013   Past Medical History:  Diagnosis Date  .  Hyperlipidemia   . Hypertension   . Osteoporosis     History reviewed. No pertinent family history.  Past Surgical History:  Procedure Laterality Date  . APPENDECTOMY    . KNEE SURGERY    . NASAL SINUS SURGERY    . TONSILLECTOMY     Social History   Occupational History  . Not on file  Tobacco Use  . Smoking status: Never Smoker  . Smokeless tobacco: Never Used  Substance and Sexual Activity  . Alcohol use: No    Alcohol/week: 0.0 oz  . Drug use: No  . Sexual activity: Not Currently

## 2017-09-26 ENCOUNTER — Ambulatory Visit: Payer: Medicare Other

## 2017-09-26 DIAGNOSIS — M25561 Pain in right knee: Secondary | ICD-10-CM

## 2017-09-26 DIAGNOSIS — M5441 Lumbago with sciatica, right side: Secondary | ICD-10-CM

## 2017-09-26 DIAGNOSIS — M5442 Lumbago with sciatica, left side: Secondary | ICD-10-CM

## 2017-09-26 DIAGNOSIS — M25562 Pain in left knee: Secondary | ICD-10-CM

## 2017-09-26 DIAGNOSIS — R262 Difficulty in walking, not elsewhere classified: Secondary | ICD-10-CM

## 2017-09-26 DIAGNOSIS — R296 Repeated falls: Secondary | ICD-10-CM

## 2017-09-26 DIAGNOSIS — R2689 Other abnormalities of gait and mobility: Secondary | ICD-10-CM

## 2017-09-26 NOTE — Therapy (Signed)
[facility] [address]  [place], [place], [address] Phone: [phone]   Fax:  [phone]  Physical Therapy Treatment  Patient Details  Name: Mikayla Moyer MRN: [id number] Date of Birth: 02/17/1956 Referring Provider: Rodell Perna, MD   Encounter Date: 09/26/2017  PT End of Session - 09/26/17 1325    Visit Number  20    Number of Visits  28    Date for PT Re-Evaluation  10/25/17    Authorization Type  UHC Medicare & Medicaid (follows Medicare guidelines - KX modifier as of 09/28/17)    PT Start Time  1313    PT Stop Time  1353    PT Time Calculation (min)  40 min    Activity Tolerance  Patient tolerated treatment well    Behavior During Therapy  [facility] for tasks assessed/performed       Past Medical History:  Diagnosis Date  . Hyperlipidemia   . Hypertension   . Osteoporosis     Past Surgical History:  Procedure Laterality Date  . APPENDECTOMY    . KNEE SURGERY    . NASAL SINUS SURGERY    . TONSILLECTOMY      There were no vitals filed for this visit.  Subjective Assessment - 09/26/17 1313    Subjective  Pt. reporting some improvement in knee pain since recieving B knee injection on Tuesday from MD however reports onset of headache since that point.      Patient Stated Goals  "Be able to walk w/o holding onto to things & pick things up off the floor w/o having to hold on"    Currently in Pain?  Yes    Pain Score  6     Pain Location  Head    Pain Orientation  Left;Right    Pain Descriptors / Indicators  Aching    Pain Type  Acute pain    Pain Onset  In the past 7 days    Pain Frequency  Constant    Aggravating Factors   "shots in knees"    Pain Relieving Factors  nothing    Multiple Pain Sites  No         [facility] PT Assessment - 09/26/17 1341      Observation/Other Assessments   Focus on Therapeutic Outcomes (FOTO)   55% (45% limitation      AROM   Right Knee Extension  30    Right Knee  Flexion  140    Left Knee Extension  18    Left Knee Flexion  141      Strength   Right/Left Hip  Right;Left    Right Hip Flexion  4+/5    Right Hip Extension  4/5    Right Hip External Rotation   4/5    Right Hip Internal Rotation  4+/5    Right Hip ABduction  4/5    Right Hip ADduction  4-/5    Left Hip Flexion  4+/5    Left Hip Extension  4/5    Left Hip External Rotation  4/5    Left Hip Internal Rotation  4+/5    Left Hip ABduction  4+/5    Left Hip ADduction  4-/5    Right/Left Knee  Right;Left    Right Knee Flexion  4/5    Right Knee Extension  3-/5    Left Knee Flexion  4+/5    Left Knee Extension  3-/5  [facility] Adult PT Treatment/Exercise - 09/26/17 1329      Ambulation/Gait   Gait Pattern  Step-through pattern;Wide base of support    Gait velocity  2.74 ft/sec      Knee/Hip Exercises: Aerobic   Recumbent Bike  lvl 3 x 6'      Knee/Hip Exercises: Machines for Strengthening   Cybex Leg Press  B LE 25# x 20       Knee/Hip Exercises: Standing   Hip Flexion  Both;10 reps;Knee straight;Stengthening    Hip Flexion Limitations  2#; counter     Forward Lunges  Right;15 reps;3 seconds;Left    Forward Lunges Limitations  mini lunge; 1 UE support on counter    Hip Abduction  Both;10 reps;Knee straight;Stengthening    Abduction Limitations  2#; counter     Hip Extension  Both;10 reps;Knee straight;Stengthening    Extension Limitations  2#; counter     Wall Squat  15 reps;5 seconds    Wall Squat Limitations  visibly improved stability    SLS  B SLS at counter (with slight knee flexion) 2 sets 3" x 5 reps; on blue foam oval 2nd set       Knee/Hip Exercises: Seated   Other Seated Knee/Hip Exercises  R/L Fitter leg press (2 black bands) x 10 each             PT Education - 09/26/17 1504    Education provided  Yes    Education Details  standin hip flexion SLR, abduction, extension     Person(s) Educated  Patient    Methods   Explanation;Demonstration;Verbal cues;Handout    Comprehension  Verbalized understanding;Returned demonstration;Verbal cues required;Need further instruction       PT Short Term Goals - 08/29/17 1423      PT SHORT TERM GOAL #1   Title  Complete initial assessment for flexibility & balance    Status  Achieved      PT SHORT TERM GOAL #2   Title  Independent with initial HEP    Status  Achieved      PT SHORT TERM GOAL #3   Title  B quad strength >/= 3/5 to reduce quad lag/instability    Status  On-going    Target Date  09/27/17      PT SHORT TERM GOAL #4   Title  Improve gait speed to >/= 2.0 ft/sec with LRAD as indicated to decrease risk for recurrent falls    Status  Achieved        PT Long Term Goals - 09/23/17 1326      PT LONG TERM GOAL #1   Title  Independent with ongoing HEP +/- gym program     Status  Partially Met met for current HEP    Target Date  10/25/17      PT LONG TERM GOAL #2   Title  B knee AROM 5-130 for normal gait mechanics    Status  Partially Met met for B knee flexion ROM    Target Date  10/25/17      PT LONG TERM GOAL #3   Title  B hip and knee strength 4-/5 to 4/5 or greater for improved gait stability    Status  Partially Met met except B knee extension    Target Date  10/25/17      PT LONG TERM GOAL #4   Title  Gait speed >/= 2.62 ft/sec with or w/o LRAD for improved safety with community ambulation  Status  Achieved            Plan - 10/22/17 1454    Clinical Impression Statement  Mikayla Moyer doing well today with primary complaint being "headache" which she states started following B knee injection on 11.27.18.  Pt. has not seen noticeable difference in B knees since injection.  Pt. encouraged to reach out to MD if headache persists.  Mikayla Moyer tolerated all LE strengthening activities well today with visible improvement in stability with mini lunge and wall sit.  Pt. reporting she was able to get up and down off of floor a few days ago  without excessive difficulty able to use UE support on bed.  Pt. progressing well toward goals with greatest strength deficit still in B quads however demonstrating good improvement with PT in gait speed and overall LE strength and stability.    Clinical Impairments Affecting Rehab Potential  R patellar tendon repair in 2010 (limited rehab at the time due to only had Medicaid insurance coverage); frequent falls; osteoprosis    PT Treatment/Interventions  Patient/family education;Therapeutic exercise;Neuromuscular re-education;Balance training;Functional mobility training;Therapeutic activities;Gait training;Stair training;Manual techniques;Taping;Dry needling;Electrical Stimulation;Moist Heat;Traction;Cryotherapy;Vasopneumatic Device;Iontophoresis 74m/ml Dexamethasone;ADLs/Self Care Home Management    PT Next Visit Plan  LE flexibility; core stabilization & proximal LE strengthening - HEP update as indicated; manual therapy & modalities PRN for pain       Patient will benefit from skilled therapeutic intervention in order to improve the following deficits and impairments:  Pain, Decreased strength, Decreased range of motion, Impaired flexibility, Abnormal gait, Difficulty walking, Decreased activity tolerance, Decreased balance  Visit Diagnosis: Acute pain of right knee  Acute pain of left knee  Acute bilateral low back pain with bilateral sciatica  Other abnormalities of gait and mobility  Difficulty in walking, not elsewhere classified  Repeated falls   G-Codes - 1December 25, 20181453    Functional Assessment Tool Used (Outpatient Only)  Knee FOTO = 55% (45% limitation)    Functional Limitation  Mobility: Walking and moving around    Mobility: Walking and Moving Around Current Status (([id number]  At least 40 percent but less than 60 percent impaired, limited or restricted    Mobility: Walking and Moving Around Goal Status [phone]  At least 40 percent but less than 60 percent impaired, limited or  restricted       Problem List Patient Active Problem List   Diagnosis Date Noted  . Acute left-sided low back pain with left-sided sciatica 06/25/2017  . Acute pain of both knees 06/25/2017  . Abnormal perimenopausal bleeding 09/30/2015  . Routine gynecological examination 08/06/2013    MBess Harvest PTA [id number] PM   [facility] [address] [place] [address]Phone: [phone]  Fax:  [phone] Name: LSHONDRIKA HOQUEMRN: [id number] of Birth: [birth year]   JPercival Spanish PT, MPT 125-Dec-2018 4:44 PM  [facility] [place] [address]Phone: [phone]  Fax:  [phone]

## 2017-10-02 ENCOUNTER — Encounter: Payer: Self-pay | Admitting: Physical Therapy

## 2017-10-02 ENCOUNTER — Ambulatory Visit: Payer: Medicare Other | Attending: Orthopaedic Surgery | Admitting: Physical Therapy

## 2017-10-02 DIAGNOSIS — R2689 Other abnormalities of gait and mobility: Secondary | ICD-10-CM

## 2017-10-02 DIAGNOSIS — M25561 Pain in right knee: Secondary | ICD-10-CM | POA: Diagnosis not present

## 2017-10-02 DIAGNOSIS — M25562 Pain in left knee: Secondary | ICD-10-CM | POA: Diagnosis present

## 2017-10-02 DIAGNOSIS — M5441 Lumbago with sciatica, right side: Secondary | ICD-10-CM | POA: Diagnosis present

## 2017-10-02 DIAGNOSIS — M5442 Lumbago with sciatica, left side: Secondary | ICD-10-CM | POA: Insufficient documentation

## 2017-10-02 DIAGNOSIS — R262 Difficulty in walking, not elsewhere classified: Secondary | ICD-10-CM | POA: Diagnosis present

## 2017-10-02 DIAGNOSIS — R296 Repeated falls: Secondary | ICD-10-CM

## 2017-10-02 NOTE — Therapy (Addendum)
[facility] [address]  [place], [place], [address] Phone: [phone]   Fax:  [phone]  Physical Therapy Treatment  Patient Details  Name: Mikayla Moyer MRN: [id number] Date of Birth: 10-17-1956 Referring Provider: Rodell Perna, MD   Encounter Date: 10/02/2017  PT End of Session - 10/02/17 0847    Visit Number  21    Number of Visits  28    Date for PT Re-Evaluation  10/25/17    Authorization Type  UHC Medicare & Medicaid (follows Medicare guidelines - KX modifier as of 09/28/17)    PT Start Time  0847    PT Stop Time  0934    PT Time Calculation (min)  47 min    Activity Tolerance  Patient tolerated treatment well    Behavior During Therapy  [facility] for tasks assessed/performed       Past Medical History:  Diagnosis Date  . Hyperlipidemia   . Hypertension   . Osteoporosis     Past Surgical History:  Procedure Laterality Date  . APPENDECTOMY    . KNEE SURGERY    . NASAL SINUS SURGERY    . TONSILLECTOMY      There were no vitals filed for this visit.  Subjective Assessment - 10/02/17 0849    Subjective  Pt reporting PA telling her that she should be able to transition to home/gym program at this point and pt concerned about going beyond insurance cap.    Pertinent History  R patellar tendon repair in 2010 - limited rehab at the time due to only had Medicaid insurance coverage    Patient Stated Goals  "Be able to walk w/o holding onto to things & pick things up off the floor w/o having to hold on"    Currently in Pain?  No/denies    Pain Score  0-No pain    Pain Onset  In the past 7 days         [facility] PT Assessment - 10/02/17 0847      Assessment   Medical Diagnosis  Acute B knee pain & LBP    Referring Provider  Rodell Perna, MD    Onset Date/Surgical Date  06/08/17    Next MD Visit  11/06/17      Observation/Other Assessments   Focus on Therapeutic Outcomes (FOTO)   Knee - 55% (45% limitation)       AROM   Right Knee Extension  25    Right Knee Flexion  140    Left Knee Extension  19    Left Knee Flexion  141      Strength   Right Hip Flexion  4+/5    Right Hip Extension  4/5    Right Hip External Rotation   4+/5    Right Hip Internal Rotation  4+/5    Right Hip ABduction  4+/5    Right Hip ADduction  4/5    Left Hip Flexion  4+/5    Left Hip Extension  4+/5    Left Hip External Rotation  4+/5    Left Hip Internal Rotation  4+/5    Left Hip ABduction  4+/5    Left Hip ADduction  4/5    Right Knee Flexion  4+/5    Right Knee Extension  3-/5    Left Knee Flexion  4+/5    Left Knee Extension  3-/5  [facility] PT Treatment/Exercise - 10/02/17 0847      Exercises   Exercises  Knee/Hip      Lumbar Exercises: Supine   Clam  15 reps;3 seconds    Clam Limitations  alt hip ABD/ER with green TB      Knee/Hip Exercises: Aerobic   Recumbent Bike  lvl 3 x 6'      Knee/Hip Exercises: Standing   Hip Flexion  Both;10 reps;Knee straight;Stengthening    Hip Flexion Limitations  looped red TB at ankles, UE support on back of chair; cues for abd bracing and quad set to maintain knee extension    Hip Abduction  Both;10 reps;Knee straight;Stengthening    Abduction Limitations  looped red TB, UE support on back of chair    Hip Extension  Both;10 reps;Knee straight;Stengthening    Extension Limitations  looped red TB, UE support on back of chair      Knee/Hip Exercises: Supine   Bridges with Clamshell  Both;15 reps + isometric hip ABD with green TB    Bridges with Clamshell  Both;10 reps;Strengthening + alt hip ABD/ER with green TB    Straight Leg Raise with External Rotation  Both;10 reps             PT Education - 10/02/17 0934    Education provided  Yes    Education Details  HEP review/update    Person(s) Educated  Patient    Methods  Explanation;Demonstration;Handout    Comprehension  Verbalized understanding;Returned demonstration        PT Short Term Goals - 08/29/17 1423      PT SHORT TERM GOAL #1   Title  Complete initial assessment for flexibility & balance    Status  Achieved      PT SHORT TERM GOAL #2   Title  Independent with initial HEP    Status  Achieved      PT SHORT TERM GOAL #3   Title  B quad strength >/= 3/5 to reduce quad lag/instability    Status  On-going    Target Date  09/27/17      PT SHORT TERM GOAL #4   Title  Improve gait speed to >/= 2.0 ft/sec with LRAD as indicated to decrease risk for recurrent falls    Status  Achieved        PT Long Term Goals - 10/02/17 8657      PT LONG TERM GOAL #1   Title  Independent with ongoing HEP +/- gym program     Status  Achieved      PT LONG TERM GOAL #2   Title  B knee AROM 5-130 for normal gait mechanics    Status  Partially Met met for B knee flexion ROM      PT LONG TERM GOAL #3   Title  B hip and knee strength 4-/5 to 4/5 or greater for improved gait stability    Status  Partially Met met except B knee extension      PT LONG TERM GOAL #4   Title  Gait speed >/= 2.62 ft/sec with or w/o LRAD for improved safety with community ambulation    Status  Achieved            Plan - 10/02/17 0934    Clinical Impression Statement  Mikayla Moyer reporting PA told her she could transition to HEP at this point and given pt nearing Medicare cap, she would like to make that transition today. As such  HEP reviewed and updated targeting continued progression of strengthening program, while reminding pt to continue with existing stretching program. Pt able to demonstrate all exercises appropriately. Goals only partially met due to persistant quad weakness limiting AROM but full PROM available in extension. Pt will be placed on hold for 30 days in the event that issues arise with HEP.    Clinical Impairments Affecting Rehab Potential  R patellar tendon repair in 2010 (limited rehab at the time due to only had Medicaid insurance coverage); frequent falls;  osteoprosis    PT Treatment/Interventions  Patient/family education;Therapeutic exercise;Neuromuscular re-education;Balance training;Functional mobility training;Therapeutic activities;Gait training;Stair training;Manual techniques;Taping;Dry needling;Electrical Stimulation;Moist Heat;Traction;Cryotherapy;Vasopneumatic Device;Iontophoresis 41m/ml Dexamethasone;ADLs/Self Care Home Management    PT Next Visit Plan  30 day hold    Consulted and Agree with Plan of Care  Patient       Patient will benefit from skilled therapeutic intervention in order to improve the following deficits and impairments:  Pain, Decreased strength, Decreased range of motion, Impaired flexibility, Abnormal gait, Difficulty walking, Decreased activity tolerance, Decreased balance  Visit Diagnosis: Acute pain of right knee  Acute pain of left knee  Acute bilateral low back pain with bilateral sciatica  Other abnormalities of gait and mobility  Difficulty in walking, not elsewhere classified  Repeated falls   G-Codes - 101/01/190934    Functional Assessment Tool Used (Outpatient Only)  Knee FOTO = 55% (45% limitation)    Functional Limitation  Mobility: Walking and moving around    Mobility: Walking and Moving Around Goal Status [phone]  At least 40 percent but less than 60 percent impaired, limited or restricted    Mobility: Walking and Moving Around Discharge Status ([phone]  At least 40 percent but less than 60 percent impaired, limited or restricted       Problem List Patient Active Problem List   Diagnosis Date Noted  . Acute left-sided low back pain with left-sided sciatica 06/25/2017  . Acute pain of both knees 06/25/2017  . Abnormal perimenopausal bleeding 09/30/2015  . Routine gynecological examination 08/06/2013    JPercival Spanish PT, MPT 101/10/2017 1:47 PM  [facility] [place] [address]Phone:  [phone]  Fax:  [phone] Name: Mikayla BOTTENFIELDMRN: [id number] of Birth: 311/06/1956 PHYSICAL THERAPY DISCHARGE SUMMARY  Visits from Start of Care: 21  Current functional level related to goals / functional outcomes:    Refer to above clinical impression for status as of last visit on 101-01-19 Pt was placed on hold for 30 days and has not needed to return to PT, therefore will proceed with discharge from PT for this episode.   Remaining deficits:   As above.   Education / Equipment:   HEP  Plan: Patient agrees to discharge.  Patient goals were partially met. Patient is being discharged due to the physician's request.  ?????    JPercival Spanish PT, MPT 12/04/17, 9:48 AM  [facility] [place] [address]Phone: [phone]  Fax:  [phone]

## 2017-11-05 ENCOUNTER — Ambulatory Visit (INDEPENDENT_AMBULATORY_CARE_PROVIDER_SITE_OTHER): Payer: Medicare Other | Admitting: Orthopaedic Surgery

## 2017-11-26 ENCOUNTER — Encounter (INDEPENDENT_AMBULATORY_CARE_PROVIDER_SITE_OTHER): Payer: Self-pay | Admitting: Orthopaedic Surgery

## 2017-11-26 ENCOUNTER — Ambulatory Visit (INDEPENDENT_AMBULATORY_CARE_PROVIDER_SITE_OTHER): Payer: Medicare Other | Admitting: Orthopaedic Surgery

## 2017-11-26 VITALS — BP 182/103 | HR 76 | Ht 65.0 in | Wt 280.0 lb

## 2017-11-26 DIAGNOSIS — M170 Bilateral primary osteoarthritis of knee: Secondary | ICD-10-CM

## 2017-11-26 NOTE — Progress Notes (Signed)
Office Visit Note   Patient: Mikayla ChristenLucille M Moyer           Date of Birth: [birth year]           MRN: [id number] Visit Date: 11/26/2017              Requested by: Fleet ContrasAvbuere, Edwin, MD [address] [place], [place] [id number] PCP: Fleet ContrasAvbuere, Edwin, MD   Assessment & Plan: Visit Diagnoses:  1. Bilateral primary osteoarthritis of knee     Plan: Good relief with previous intra-articular cortisone injections.  Blood pressure is elevated and we discussed she needs to take care of this.  He has more medial compartment osteoarthritis bilaterally with some varus and will return if she has increased symptoms.  Follow-Up Instructions: Return if symptoms worsen or fail to improve.   Orders:  No orders of the defined types were placed in this encounter.  No orders of the defined types were placed in this encounter.     Procedures: No procedures performed   Clinical Data: No additional findings.   Subjective: Chief Complaint  Patient presents with  . Left Knee - Follow-up  . Right Knee - Follow-up    HPI 62 year old female seen with bilateral knee pain.  Previous injection 09/19/2017 states that after the injection she had problems with headache.  She had difficulty lifting her head and her blood pressure was elevated she had to lay down and rest.  She takes her blood pressure medicine at night since it tends to make her drowsy is not had her blood pressure medicine this morning and her blood pressure is elevated at 182/103.  Currently she states her knees are not hurting.  She is doing exercises at home for her legs including quadriceps exercises.  Walking better.  Review of Systems positive for history of knee osteoarthritis bilaterally, low back pain with left-sided sciatica, history of abnormal perimenopausal bleeding Mobid obesity and hypertension .    Negative Respiratory, GI, cardiac.  14 point review of systems negative as it pertains HPI.   Objective: Vital Signs: BP (!) 182/103    Pulse 76   Ht 5\' 5"  (1.651 m)   Wt 280 lb (127 kg)   BMI 46.59 kg/m   Physical Exam  Constitutional: She is oriented to person, place, and time. She appears well-developed.  HENT:  Head: Normocephalic.  Right Ear: External ear normal.  Left Ear: External ear normal.  Eyes: Pupils are equal, round, and reactive to light.  Neck: No tracheal deviation present. No thyromegaly present.  Cardiovascular: Normal rate.  Pulmonary/Chest: Effort normal.  Abdominal: Soft.  Neurological: She is alert and oriented to person, place, and time.  Skin: Skin is warm and dry.  Psychiatric: She has a normal mood and affect. Her behavior is normal.    Ortho Exam patient has some mild tenderness over the left trochanter.  Crepitus with knee range of motion bilaterally.  Distal pulses are palpable.  No pitting edema.  Anterior tib gastrocsoleus is strong.  Specialty Comments:  No specialty comments available.  Imaging: No results found.   PMFS History: Patient Active Problem List   Diagnosis Date Noted  . Acute left-sided low back pain with left-sided sciatica 06/25/2017  . Acute pain of both knees 06/25/2017  . Abnormal perimenopausal bleeding 09/30/2015  . Routine gynecological examination 08/06/2013   Past Medical History:  Diagnosis Date  . Hyperlipidemia   . Hypertension   . Osteoporosis     No family history on file.  Past Surgical History:  Procedure Laterality Date  . APPENDECTOMY    . KNEE SURGERY    . NASAL SINUS SURGERY    . TONSILLECTOMY     Social History   Occupational History  . Not on file  Tobacco Use  . Smoking status: Never Smoker  . Smokeless tobacco: Never Used  Substance and Sexual Activity  . Alcohol use: No    Alcohol/week: 0.0 oz  . Drug use: No  . Sexual activity: Not Currently

## 2017-11-27 ENCOUNTER — Encounter (INDEPENDENT_AMBULATORY_CARE_PROVIDER_SITE_OTHER): Payer: Self-pay | Admitting: Orthopaedic Surgery

## 2018-01-06 ENCOUNTER — Ambulatory Visit (INDEPENDENT_AMBULATORY_CARE_PROVIDER_SITE_OTHER): Payer: Medicare Other | Admitting: Obstetrics & Gynecology

## 2018-01-06 ENCOUNTER — Ambulatory Visit (HOSPITAL_BASED_OUTPATIENT_CLINIC_OR_DEPARTMENT_OTHER)
Admission: RE | Admit: 2018-01-06 | Discharge: 2018-01-06 | Disposition: A | Discharge status: Home / Self Care | Payer: Medicare Other | Source: Ambulatory Visit | Attending: Obstetrics & Gynecology | Admitting: Obstetrics & Gynecology

## 2018-01-06 ENCOUNTER — Encounter: Payer: Self-pay | Admitting: Obstetrics & Gynecology

## 2018-01-06 ENCOUNTER — Other Ambulatory Visit (HOSPITAL_COMMUNITY)
Admission: RE | Admit: 2018-01-06 | Discharge: 2018-01-06 | Disposition: A | Discharge status: Home / Self Care | Payer: Medicare Other | Source: Ambulatory Visit | Attending: Obstetrics & Gynecology | Admitting: Obstetrics & Gynecology

## 2018-01-06 VITALS — BP 160/98 | HR 101 | Ht 65.0 in | Wt 278.0 lb

## 2018-01-06 DIAGNOSIS — Z01419 Encounter for gynecological examination (general) (routine) without abnormal findings: Secondary | ICD-10-CM | POA: Diagnosis not present

## 2018-01-06 DIAGNOSIS — N950 Postmenopausal bleeding: Secondary | ICD-10-CM

## 2018-01-06 DIAGNOSIS — Z1239 Encounter for other screening for malignant neoplasm of breast: Secondary | ICD-10-CM

## 2018-01-06 NOTE — Progress Notes (Signed)
Subjective:     Mikayla Moyer is a 62 y.o. female here for a routine exam.  Current complaints: Pt here for a routine PAP.  Pt denies bleeding for >1 year.  She thinks it was the 'early part of last year. Maybe Feb or Mar.'    Not sexually active. Pt denies weight loss or other constitutional signs or sx.      Gynecologic History No LMP recorded. Contraception: abstinence; menopause Last Pap: 09/2015. Results were: normal Last mammogram: 02/2017. Results were: normal  Obstetric History OB History  Gravida Para Term Preterm AB Living  6 4 4         SAB TAB Ectopic Multiple Live Births          4    # Outcome Date GA Lbr Len/2nd Weight Sex Delivery Anes PTL Lv  6 Gravida           5 Gravida           4 Term      Vag-Spont     3 Term      Vag-Spont     2 Term      Vag-Spont     1 Term      Vag-Spont         The following portions of the patient's history were reviewed and updated as appropriate: allergies, current medications, past family history, past medical history, past social history, past surgical history and problem list.  Review of Systems Pertinent items are noted in HPI.    Objective:  BP (!) 160/98   Pulse (!) 101   Ht 5\' 5"  (1.651 m)   Wt 278 lb (126.1 kg)   BMI 46.26 kg/m   General Appearance:    Alert, cooperative, no distress, appears stated age  Head:    Normocephalic, without obvious abnormality, atraumatic  Eyes:    conjunctiva/corneas clear, EOM's intact, both eyes  Ears:    Normal external ear canals, both ears  Nose:   Nares normal, septum midline, mucosa normal, no drainage    or sinus tenderness  Throat:   Lips, mucosa, and tongue normal; teeth and gums normal  Neck:   Supple, symmetrical, trachea midline, no adenopathy;    thyroid:  no enlargement/tenderness/nodules  Back:     Symmetric, no curvature, ROM normal, no CVA tenderness  Lungs:     Clear to auscultation bilaterally, respirations unlabored  Chest Wall:    No tenderness or deformity   Heart:    Regular rate and rhythm, S1 and S2 normal, no murmur, rub   or gallop  Breast Exam:    No tenderness, masses, or nipple abnormality  Abdomen:     Soft, non-tender, bowel sounds active all four quadrants,    no masses, no organomegaly  Genitalia:    Normal female without lesion, discharge or tenderness     Extremities:   Extremities normal, atraumatic, no cyanosis or edema  Pulses:   2+ and symmetric all extremities  Skin:   Skin color, texture, turgor normal, no rashes or lesions   Assessment:    Healthy female exam.   Breast cancer screen Post menopausal bleeding-  Pt has late menopause into her 48's. She thinks she had some light pink spotting x1 earlier this year. Given her h/o late bleeding will get an [place] to assess.    Plan:    Mammogram ordered. Follow up in: 1 year.    Pt educated about the need to f/u for a  endo bx if she begins to bleed or if she has a thickened endometrial stripe.   Taishawn Smaldone L. Harraway-Smith, M.D., Evern CoreFACOG

## 2018-01-07 ENCOUNTER — Telehealth: Payer: Self-pay

## 2018-01-07 NOTE — Telephone Encounter (Signed)
Patient called and made aware that she will need an endometrial biopsy. Explained that Dr. Harraway Smith reErin Fullingviewed her ultrasound and it shows we do need to do the endometrial biopsy. Patient states understanding and scheduled for this Friday March 15th. Mikayla StammerJennifer Howard RNBSN

## 2018-01-08 LAB — CYTOLOGY - PAP
DIAGNOSIS: NEGATIVE
HPV: DETECTED — AB

## 2018-01-10 ENCOUNTER — Ambulatory Visit (INDEPENDENT_AMBULATORY_CARE_PROVIDER_SITE_OTHER): Payer: Medicare Other | Admitting: Obstetrics & Gynecology

## 2018-01-10 ENCOUNTER — Encounter: Payer: Self-pay | Admitting: Obstetrics & Gynecology

## 2018-01-10 DIAGNOSIS — N924 Excessive bleeding in the premenopausal period: Secondary | ICD-10-CM

## 2018-01-10 NOTE — Progress Notes (Signed)
Pt had an episode of PMPB. The indications for endometrial biopsy were reviewed.  sh has had not further bleeding since her last visit. Risks of the biopsy including cramping, bleeding, infection, uterine perforation, inadequate specimen and need for additional procedures  were discussed. The patient states she understands and agrees to undergo procedure today. Consent was signed. Time out was performed.   A sterile speculum was placed in the patient's vagina and the cervix was prepped with Betadine. A single-toothed tenaculum was placed on the anterior lip of the cervix to stabilize it. The 3 mm pipelle was introduced into the endometrial cavity without difficulty to a depth of 6cm, and a moderate amount of tissue was obtained and sent to pathology. The instruments were removed from the patient's vagina. Minimal bleeding from the cervix was noted. The patient tolerated the procedure well. Routine post-procedure instructions were given to the patient. The patient will follow up to review the results and for further management.    Nalina Yeatman L. Harraway-Smith, M.D., Evern CoreFACOG

## 2018-01-10 NOTE — Addendum Note (Signed)
Addended by: Willodean RosenthalHARRAWAY-SMITH, Jaclynne Baldo on: 01/10/2018 10:07 AM   Modules accepted: Orders

## 2018-01-10 NOTE — Progress Notes (Signed)
Patient presents for Endometrial biopsy. Kenia Teagarden RN 

## 2018-01-10 NOTE — Patient Instructions (Signed)

## 2018-01-28 ENCOUNTER — Telehealth: Payer: Self-pay

## 2018-01-28 NOTE — Telephone Encounter (Signed)
Attempted to reach patient by phone to let her know her endometrial biopsy was negative but recording states "the service you are trying to use is disconnected". Armandina StammerJennifer Howard RNBSN

## 2018-02-13 ENCOUNTER — Ambulatory Visit: Payer: Medicare Other | Admitting: Obstetrics & Gynecology

## 2018-02-18 ENCOUNTER — Other Ambulatory Visit (HOSPITAL_BASED_OUTPATIENT_CLINIC_OR_DEPARTMENT_OTHER): Payer: Self-pay | Admitting: Internal Medicine

## 2018-02-19 ENCOUNTER — Other Ambulatory Visit (HOSPITAL_BASED_OUTPATIENT_CLINIC_OR_DEPARTMENT_OTHER): Payer: Self-pay | Admitting: Internal Medicine

## 2018-02-19 ENCOUNTER — Other Ambulatory Visit: Payer: Self-pay | Admitting: Obstetrics & Gynecology

## 2018-02-19 DIAGNOSIS — E2839 Other primary ovarian failure: Secondary | ICD-10-CM

## 2018-03-06 ENCOUNTER — Encounter: Payer: Self-pay | Admitting: Obstetrics & Gynecology

## 2018-03-06 ENCOUNTER — Ambulatory Visit (INDEPENDENT_AMBULATORY_CARE_PROVIDER_SITE_OTHER): Payer: Medicare Other | Admitting: Obstetrics & Gynecology

## 2018-03-06 VITALS — BP 179/79 | HR 77 | Ht 65.0 in | Wt 282.0 lb

## 2018-03-06 DIAGNOSIS — R030 Elevated blood-pressure reading, without diagnosis of hypertension: Secondary | ICD-10-CM

## 2018-03-06 DIAGNOSIS — N950 Postmenopausal bleeding: Secondary | ICD-10-CM | POA: Diagnosis not present

## 2018-03-06 NOTE — Progress Notes (Signed)
First BP was 183/89. Repeat BP 179/79 . Pt c/o headache. Pt has not had anymore bleeding since last visit.

## 2018-03-06 NOTE — Progress Notes (Signed)
History:  62 y.o. G6P4000 here today for eval of PMPB. Pt reports no further h/o bleeding since her last visit here. Her BP is elevated she reports that she did not take her meds this am as she was rushing to come here. 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 Sh denies and weakness or HA.     The following portions of the patient's history were reviewed and updated as appropriate: allergies, current medications, past family history, past medical history, past social history, past surgical history and problem list.  Review of Systems:  Pertinent items are noted in HPI.    Objective:  Physical Exam Blood pressure (!) 179/79, pulse 77, height  (1.651 m), weight 282 lb (127.9 kg).  CONSTITUTIONAL: Well-developed, well-nourished female in no acute distress.  HENT:  Normocephalic, atraumatic EYES: Conjunctivae and EOM are normal. No scleral icterus.  NECK: Normal range of motion SKIN: Skin is warm and dry. No rash noted. Not diaphoretic.No pallor. NEUROLGIC: Alert and oriented to person, place, and time. Normal coordination.  Pelvic: not done  Labs and Imaging 01/06/2018 CLINICAL DATA:  Postmenopausal bleeding.  EXAM: TRANSABDOMINAL AND TRANSVAGINAL ULTRASOUND OF PELVIS  TECHNIQUE: Both transabdominal and transvaginal ultrasound examinations of the pelvis were performed. Transabdominal technique was performed for global imaging of the pelvis including uterus, ovaries, adnexal regions, and pelvic cul-de-sac. It was necessary to proceed with endovaginal exam following the transabdominal exam to visualize the endometrial stripe and ovaries.  COMPARISON:  07/10/2013  FINDINGS: Uterus  Measurements: 10.8 x 4.9 x 5.4 cm. No fibroids or other mass visualized.  Endometrium  Thickness: 8 mm.  No focal abnormality visualized.  Right ovary  Measurements: 1.8 x 2.3 x 1.9 cm. Normal appearance/no adnexal mass.  Left ovary  Measurements:  2.2 x 1.9 x 2.5 cm. Normal appearance/no adnexal mass.  Other findings  No abnormal free fluid.  IMPRESSION: Endometrial thickness measures 8 mm. In the setting of post-menopausal bleeding, endometrial sampling is indicated to exclude carcinoma. If results are benign, sonohysterogram should be considered for focal lesion work-up. (Ref: Radiological Reasoning: Algorithmic Workup of Abnormal Vaginal Bleeding with Endovaginal Sonography and Sonohysterography. AJR 2008; 914:N82-95).   01/10/2018 Diagnosis Endometrium, biopsy - BENIGN ENDOCERVICAL EPITHELIUM AND ATROPHIC SQUAMOUS EPITHELIUM IN A BACKGROUND OF MUCOID AND INFLAMMATORY MATERIAL. - ENDOMETRIAL GLANDS AND STROMA NOT IDENTIFIED. - NO ATYPIA, DYSPLASIA OR MALIGNANCY IDENTIFIED.   Assessment & Plan:  Elevated BP   Pt did not take her meds or eat this am  Does take her meds daily after breakfast.   Pt committed to going directly home and taking her BP meds and resting. She will take  her BP later today and f/u with primary care if it remains elevated.   PMPB  No further bleeding   Bx was neg   F/u 1 year or sooner prn ANY further bleeding. If bleeding occurs pt will need a hysteroscopy.  Total face-to-face time with patient was 15 min.  Greater than 50% was spent in counseling and coordination of care with the patient.   Devlon Dosher L. Harraway-Smith, M.D., [place] Core

## 2018-03-06 NOTE — Patient Instructions (Signed)

## 2018-03-10 ENCOUNTER — Encounter: Payer: Self-pay | Admitting: Obstetrics & Gynecology

## 2018-03-11 ENCOUNTER — Ambulatory Visit (HOSPITAL_BASED_OUTPATIENT_CLINIC_OR_DEPARTMENT_OTHER)
Admission: RE | Admit: 2018-03-11 | Discharge: 2018-03-11 | Disposition: A | Discharge status: Home / Self Care | Payer: Medicare Other | Source: Ambulatory Visit | Attending: Obstetrics & Gynecology | Admitting: Obstetrics & Gynecology

## 2018-03-11 ENCOUNTER — Ambulatory Visit (HOSPITAL_BASED_OUTPATIENT_CLINIC_OR_DEPARTMENT_OTHER)
Admission: RE | Admit: 2018-03-11 | Discharge: 2018-03-11 | Disposition: A | Discharge status: Home / Self Care | Payer: Medicare Other | Source: Ambulatory Visit | Attending: Internal Medicine | Admitting: Internal Medicine

## 2018-03-11 ENCOUNTER — Encounter (HOSPITAL_BASED_OUTPATIENT_CLINIC_OR_DEPARTMENT_OTHER): Payer: Self-pay | Admitting: Radiology

## 2018-03-11 DIAGNOSIS — E2839 Other primary ovarian failure: Secondary | ICD-10-CM

## 2018-03-11 DIAGNOSIS — Z1231 Encounter for screening mammogram for malignant neoplasm of breast: Secondary | ICD-10-CM | POA: Insufficient documentation

## 2018-03-11 DIAGNOSIS — Z1239 Encounter for other screening for malignant neoplasm of breast: Secondary | ICD-10-CM

## 2020-01-29 ENCOUNTER — Other Ambulatory Visit (HOSPITAL_BASED_OUTPATIENT_CLINIC_OR_DEPARTMENT_OTHER): Payer: Self-pay | Admitting: Internal Medicine

## 2020-01-29 DIAGNOSIS — Z1231 Encounter for screening mammogram for malignant neoplasm of breast: Secondary | ICD-10-CM

## 2020-02-03 ENCOUNTER — Ambulatory Visit (HOSPITAL_BASED_OUTPATIENT_CLINIC_OR_DEPARTMENT_OTHER): Payer: Medicare Other

## 2020-02-04 ENCOUNTER — Other Ambulatory Visit: Payer: Self-pay

## 2020-02-04 ENCOUNTER — Ambulatory Visit (HOSPITAL_BASED_OUTPATIENT_CLINIC_OR_DEPARTMENT_OTHER)
Admission: RE | Admit: 2020-02-04 | Discharge: 2020-02-04 | Disposition: A | Discharge status: Home / Self Care | Payer: Medicare Other | Source: Ambulatory Visit | Attending: Internal Medicine | Admitting: Internal Medicine

## 2020-02-04 DIAGNOSIS — Z1231 Encounter for screening mammogram for malignant neoplasm of breast: Secondary | ICD-10-CM | POA: Diagnosis not present

## 2023-08-27 ENCOUNTER — Encounter: Payer: Self-pay | Admitting: Internal Medicine

## 2023-08-27 ENCOUNTER — Other Ambulatory Visit: Payer: Self-pay

## 2023-08-27 ENCOUNTER — Ambulatory Visit (INDEPENDENT_AMBULATORY_CARE_PROVIDER_SITE_OTHER): Payer: 59 | Admitting: Internal Medicine

## 2023-08-27 VITALS — BP 138/88 | HR 73 | Temp 97.3°F | Resp 16 | Ht 65.0 in | Wt 287.3 lb

## 2023-08-27 DIAGNOSIS — J329 Chronic sinusitis, unspecified: Secondary | ICD-10-CM | POA: Diagnosis not present

## 2023-08-27 DIAGNOSIS — K219 Gastro-esophageal reflux disease without esophagitis: Secondary | ICD-10-CM

## 2023-08-27 DIAGNOSIS — R21 Rash and other nonspecific skin eruption: Secondary | ICD-10-CM | POA: Diagnosis not present

## 2023-08-27 DIAGNOSIS — J3089 Other allergic rhinitis: Secondary | ICD-10-CM | POA: Diagnosis not present

## 2023-08-27 MED ORDER — OMEPRAZOLE 20 MG PO CPDR: 20.0000 mg | DELAYED_RELEASE_CAPSULE | Freq: Every day | ORAL | 5 refills | Status: DC | PRN

## 2023-08-27 MED ORDER — FLUTICASONE PROPIONATE 50 MCG/ACT NA SUSP: 2.0000 | Freq: Every day | NASAL | 5 refills | Status: AC

## 2023-08-27 NOTE — Patient Instructions (Addendum)
Other Allergic Rhinitis Recurrent Sinus Infection  - Use nasal saline rinses before nose sprays such as with Neilmed Sinus Rinse.  Use distilled water.   - Use Flonase 1-2 sprays each nostril daily. Aim upward and outward. - Hold all anti histamines 3 days prior (Zyrtec, Claritin, Allegra, Xyzal, Benadryl)   GERD - Use Omeprazole 20mg  daily in morning on an empty stomach.  Eat a small meal about 30-45 minutes after.  -Avoid lying down for at least two hours after a meal or after drinking acidic beverages, like soda, or other caffeinated beverages. This can help to prevent stomach contents from flowing back into the esophagus. -Keep your head elevated while you sleep. Using an extra pillow or two can also help to prevent reflux. -Eat smaller and more frequent meals each day instead of a few large meals. This promotes digestion and can aid in preventing heartburn. -Wear loose-fitting clothes to ease pressure on the stomach, which can worsen heartburn and reflux. -Reduce excess weight around the midsection. This can ease pressure on the stomach. Such pressure can force some stomach contents back up the esophagus.   Rashes - Discussed this is not eczema.  If this persists, recommend seeing a Dermatologist.   Follow up: 11/5, next Tuesday at 8:30 AM for skin testing

## 2023-08-27 NOTE — Progress Notes (Signed)
NEW PATIENT  Date of Service/Encounter:  08/27/23  Consult requested by: Fleet Contras, MD   Subjective:   Mikayla Moyer (DOB: November 11, 1955) is a 67 y.o. female who presents to the clinic on 08/27/2023 with a chief complaint of Allergy Testing (Environmental: ALL/Food: No Hx) and Eczema .    History obtained from: chart review and patient.   Rhinitis/Recurrent Sinus Infections  Started around elementary school.   Symptoms include:  ear fullness, sinus pressure, headaches, nasal congestion, and post nasal drainage  Antibiotics do help with her symptoms but then it recurs. Not sure how many courses this year.  Denies any hx of hospitalization for infection, long term antibiotic use, deep infections.  Occurs year-round Potential triggers: none   Treatments tried:  Zyrtec and other oral anti histamines; caused sleepiness Flonase PRN  Previous allergy testing: no History of sinus surgery: yes with ENT; open up sinuses  Nonallergic triggers: none   Denies hx of asthma, was given an albuterol inhaler x1 a long time ago when she was sick.  Denies trouble with chronic cough/SOB/wheezing.   Reflux: Did have trouble with reflux and heartburn but now doing better on Omeprazole.   Rashes: Reports having itchy rashes.  Starts off as a small bump and then turns into a dark patch, sometimes purple. On mometasone given by PCP. Denies any hx of eczema.  Thinks it helps with the itching but the rash keeps recurring.   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 If you have any questions, ask your nurse or doctor.          STOP taking these medications    pravastatin 40 MG tablet Commonly known as: PRAVACHOL Stopped by: Ellen Henri Demeisha Geraghty   tiZANidine 4 MG tablet Commonly known as: ZANAFLEX Stopped by: Birder Robson   triamcinolone 55 MCG/ACT Aero nasal inhaler Commonly known as: NASACORT Stopped by: Birder Robson   Vitamin D (Ergocalciferol) 1.25 MG (50000 UNIT) Caps capsule Commonly known as: DRISDOL Stopped by: Birder Robson       TAKE these medications    albuterol 108 (90 Base) MCG/ACT inhaler Commonly known as: VENTOLIN HFA Inhale 2 puffs into the lungs every 4 (four) hours as needed for wheezing or shortness of breath.   amLODipine 10 MG tablet Commonly known as: NORVASC Take 10 mg by mouth daily.   azelastine 0.1 % nasal spray Commonly known as: ASTELIN Place 2 sprays into both nostrils 2 (two) times daily.   benazepril 5 MG tablet Commonly known as: LOTENSIN Take 5 mg by mouth.   carvedilol 12.5 MG tablet Commonly known as: COREG Take 12.5 mg by mouth 2 (two)  times daily with a meal.   cetirizine 10 MG tablet Commonly known as: ZYRTEC Take 10 mg by mouth daily as needed. allergies   fish oil-omega-3 fatty acids 1000 MG capsule Take 1 g by mouth daily.   fluticasone 27.5 MCG/SPRAY nasal spray Commonly known as: VERAMYST Place 2 sprays into the nose daily as needed for rhinitis.   fluticasone 50 MCG/ACT nasal spray Commonly known as: FLONASE Place 2 sprays into the nose daily as needed for allergies.   furosemide 20 MG tablet Commonly known as: LASIX Take 60 mg by mouth daily.   gabapentin 300 MG capsule Commonly known as: NEURONTIN Take 300 mg by mouth at bedtime.   gabapentin 100 MG  capsule Commonly known as: NEURONTIN Take 100 mg by mouth daily.   GARLIC PO Take 1 tablet by mouth as needed.   hydrALAZINE 50 MG tablet Commonly known as: APRESOLINE Take 50 mg by mouth daily.   hydrocortisone cream 1 % Apply 1 Application topically 2 (two) times daily.   ibuprofen 800 MG tablet Commonly known as: ADVIL   losartan-hydrochlorothiazide 100-25 MG tablet Commonly known as: HYZAAR Take 1 tablet by mouth daily.   meloxicam 15 MG tablet Commonly known as: MOBIC Take 15 mg by mouth daily as needed for pain.   mometasone 0.1 % cream Commonly known as: ELOCON Apply 1 application topically 2 (two) times daily as needed (finger rash).   nebivolol 5 MG tablet Commonly known as: BYSTOLIC Take 5 mg by mouth daily.   omeprazole 20 MG capsule Commonly known as: PRILOSEC Take 20 mg by mouth daily as needed (heartburn/ acid reflux).   OneTouch Verio Flex System w/Device Kit 1 Device by Does not apply route as directed.   Ozempic (0.25 or 0.5 MG/DOSE) 2 MG/3ML Sopn Generic drug: Semaglutide(0.25 or 0.5MG /DOS) Inject 0.5 mg into the skin once a week.   polyethylene glycol powder 17 GM/SCOOP powder Commonly known as: GLYCOLAX/MIRALAX Take 17 g by mouth daily as needed. constipation   rosuvastatin 20 MG tablet Commonly known as: CRESTOR Take 20 mg by mouth at bedtime.         REVIEW OF SYSTEMS: Pertinent positives and negatives discussed in HPI.   Objective:   Physical Exam: BP 138/88 (BP Location: Left Arm, Patient Position: Sitting, Cuff Size: Large)   Pulse 73   Temp (!) 97.3 F (36.3 C) (Temporal)   Resp 16   Ht 5\' 5"  (1.651 m)   Wt 287 lb 4.8 oz (130.3 kg)   LMP 09/28/2015   SpO2 98%   BMI 47.81 kg/m  Body mass index is 47.81 kg/m. GEN: alert, well developed HEENT: clear conjunctiva, TM grey and translucent, nose with + moderate inferior turbinate hypertrophy, pink nasal mucosa, slight clear rhinorrhea, + cobblestoning HEART: regular rate  and rhythm, no murmur LUNGS: clear to auscultation bilaterally, no coughing, unlabored respiration ABDOMEN: soft, non distended  SKIN: small papular lesion on R hand, L foreleg with a quarter sized patch that appears violaceous and hyperpigmented   Reviewed:  07/12/2022: seen by urgent care for sinus congestion, cough, ear pressure, fatigue, headaches.  Dx with acute bacterial sinusitis.   07/25/2022: seen by PCP for wellness visit, breast cancer screening and osteoporosis screening. Also with PMH of HTN, HLD, GERD, DM, CKD.  07/30/2023: seen by PCP Deneda NP for several weeks of sneezing, itchy watery eyes, dry cough, nasal congestion, facial pressure.  Referred to Allergy for further evaluation.    Assessment:   1. Other allergic rhinitis  2. Recurrent sinus infections   3. Gastroesophageal reflux disease without esophagitis   4. Rash and nonspecific skin eruption     Plan/Recommendations:  Other Allergic Rhinitis Recurrent Sinus Infection  - Due to turbinate hypertrophy, recurrent antibiotic use for frequent sinus infections and unresponsive to over the counter meds, will perform skin testing to identify aeroallergen triggers at next visit.   - Use nasal saline rinses before nose sprays such as with Neilmed Sinus Rinse.  Use distilled water.   - Use Flonase 1-2 sprays each nostril daily. Aim upward and outward. - Hold all anti histamines 3 days prior (Zyrtec, Claritin, Allegra, Xyzal, Benadryl)   GERD - Use Omeprazole 20mg  daily in morning on an empty stomach.  Eat a small meal about 30-45 minutes after.  -Avoid lying down for at least two hours after a meal or after drinking acidic beverages, like soda, or other caffeinated beverages. This can help to prevent stomach contents from flowing back into the esophagus. -Keep your head elevated while you sleep. Using an extra pillow or two can also help to prevent reflux. -Eat smaller and more frequent meals each day instead of a few large  meals. This promotes digestion and can aid in preventing heartburn. -Wear loose-fitting clothes to ease pressure on the stomach, which can worsen heartburn and reflux. -Reduce excess weight around the midsection. This can ease pressure on the stomach. Such pressure can force some stomach contents back up the esophagus.   Rashes - Discussed this is not eczema.  If this persists, recommend seeing a Dermatologist.   Follow up: 11/5, next Tuesday at 8:30 AM for skin testing 1-55    No follow-ups on file.  [name], MD [facility]

## 2023-09-03 ENCOUNTER — Ambulatory Visit (INDEPENDENT_AMBULATORY_CARE_PROVIDER_SITE_OTHER): Payer: 59 | Admitting: Internal Medicine

## 2023-09-03 DIAGNOSIS — J3089 Other allergic rhinitis: Secondary | ICD-10-CM | POA: Diagnosis not present

## 2023-09-03 DIAGNOSIS — J329 Chronic sinusitis, unspecified: Secondary | ICD-10-CM | POA: Diagnosis not present

## 2023-09-03 MED ORDER — AZELASTINE HCL 0.1 % NA SOLN: 2.0000 | Freq: Two times a day (BID) | NASAL | 5 refills | Status: DC | PRN

## 2023-09-03 NOTE — Progress Notes (Signed)
FOLLOW UP Date of Service/Encounter:  09/03/23   Subjective:  Mikayla Moyer (DOB: August 27, 1956) is a 67 y.o. female who [facility] for follow up for skin testing.   History obtained from: chart review and patient.  Anti histamines held Not sick  Past Medical History: Past Medical History:  Diagnosis Date   Hyperlipidemia    Hypertension    Osteoporosis     Objective:  LMP 09/28/2015  There is no height or weight on file to calculate BMI. Physical Exam: GEN: alert, well developed HEENT: clear conjunctiva, MMM LUNGS: no coughing, unlabored respiration SKIN: no rashes or lesions  Skin Testing:  Skin prick testing was placed, which includes aeroallergens/foods, histamine control, and saline control.  Verbal consent was obtained prior to placing test.  Patient tolerated procedure well.  Allergy testing results were read and interpreted by myself, documented by clinical staff. Adequate positive and negative control.  Positive results to:  Results discussed with patient/family.  Airborne Adult Perc - 09/03/23 0902     Time Antigen Placed 0845    Allergen Manufacturer Waynette Buttery    Location Back    Number of Test 55    Panel 1 Select    1. Control-Buffer 50% Glycerol Negative    2. Control-Histamine 3+    3. Bahia Negative    4. French Southern Territories Negative    5. Johnson Negative    6. Kentucky Blue Negative    7. Meadow Fescue Negative    8. Perennial Rye Negative    9. Timothy Negative    10. Ragweed Mix Negative    11. Cocklebur Negative    12. Plantain,  English Negative    13. Baccharis Negative    14. Dog Fennel Negative    15. Russian Thistle Negative    16. Lamb's Quarters Negative    17. Sheep Sorrell Negative    18. Rough Pigweed Negative    19. Marsh Elder, Rough Negative    20. Mugwort, Common Negative    21. Box, Elder Negative    22. Cedar, red Negative    23. Sweet Gum Negative    24. Pecan Pollen Negative    25.  Pine Mix Negative    26. Walnut, Black Pollen Negative    27. Red Mulberry Negative    28. Ash Mix Negative    29. Birch Mix Negative    30. Beech American Negative    31. Cottonwood, Guinea-Bissau Negative    32. Hickory, White Negative    33. Maple Mix Negative    34. Oak, Guinea-[place]iss[place] Mix Negative    35. Sycamore Eastern Negative    36. Alternaria Alternata Negative    37. Cladosporium Herbarum Negative    38. Aspergillus Mix Negative    39. Penicillium Mix Negative    40. Bipolaris Sorokiniana (Helminthosporium) Negative    41. Drechslera Spicifera (Curvularia) Negative    42. Mucor Plumbeus Negative    43. Fusarium Moniliforme Negative    44. Aureobasidium Pullulans (pullulara) Negative    45. Rhizopus Oryzae Negative    46. Botrytis Cinera Negative    47. Epicoccum Nigrum Negative    48. Phoma Betae Negative    49. Dust Mite Mix Negative    50. Cat Hair 10,000 BAU/ml Negative    51.  Dog Epithelia Negative    52. Mixed Feathers Negative    53. Horse Epithelia Negative    54. Cockroach, German Negative    55.  Tobacco Leaf Negative             Intradermal - 09/03/23 0900     Time Antigen Placed 1610    Allergen Manufacturer Waynette Buttery    Location Arm    Number of Test 16    Control Negative    Bahia Negative    French Southern Territories Negative    Johnson Negative    7 Grass Negative    Ragweed Mix Negative    Weed Mix Negative    Tree Mix Negative    Mold 1 Negative    Mold 2 Negative    Mold 3 Negative    Mold 4 Negative    Mite Mix Negative    Cat Negative    Dog Negative    Cockroach Negative              Assessment:   1. Other allergic rhinitis   2. Recurrent sinus infections     Plan/Recommendations:  Other Allergic Rhinitis  Recurrent Sinus Infection  - Due to turbinate hypertrophy, recurrent antibiotic use for frequent sinus infections and unresponsive to over the counter meds, will perform skin testing to identify aeroallergen triggers at next visit.   -  Positive skin test 08/2023: none  - Use nasal saline rinses before nose sprays such as with Neilmed Sinus Rinse.  Use distilled water.   - Use Flonase 2 sprays each nostril daily. Aim upward and outward. - Use Azelastine 1-2 sprays each nostril twice daily as needed for runny nose, drainage, sneezing, congestion. Aim upward and outward.  GERD - Use Omeprazole 20mg  daily in morning on an empty stomach.  Eat a small meal about 30-45 minutes after.  -Avoid lying down for at least two hours after a meal or after drinking acidic beverages, like soda, or other caffeinated beverages. This can help to prevent stomach contents from flowing back into the esophagus. -Keep your head elevated while you sleep. Using an extra pillow or two can also help to prevent reflux. -Eat smaller and more frequent meals each day instead of a few large meals. This promotes digestion and can aid in preventing heartburn. -Wear loose-fitting clothes to ease pressure on the stomach, which can worsen heartburn and reflux. -Reduce excess weight around the midsection. This can ease pressure on the stomach. Such pressure can force some stomach contents back up the esophagus.     Return in about 6 weeks (around 10/15/2023).  Mikayla Morin, MD [facility]

## 2023-09-03 NOTE — Patient Instructions (Addendum)
Chronic Rhinitis  Recurrent Sinus Infection  - Positive skin test 08/2023: none  - Use nasal saline rinses before nose sprays such as with Neilmed Sinus Rinse.  Use distilled water.   - Use Flonase 2 sprays each nostril daily. Aim upward and outward. - Use Azelastine 1-2 sprays each nostril twice daily as needed for runny nose, drainage, sneezing, congestion. Aim upward and outward.  GERD - Use Omeprazole 20mg  daily in morning on an empty stomach.  Eat a small meal about 30-45 minutes after.  -Avoid lying down for at least two hours after a meal or after drinking acidic beverages, like soda, or other caffeinated beverages. This can help to prevent stomach contents from flowing back into the esophagus. -Keep your head elevated while you sleep. Using an extra pillow or two can also help to prevent reflux. -Eat smaller and more frequent meals each day instead of a few large meals. This promotes digestion and can aid in preventing heartburn. -Wear loose-fitting clothes to ease pressure on the stomach, which can worsen heartburn and reflux. -Reduce excess weight around the midsection. This can ease pressure on the stomach. Such pressure can force some stomach contents back up the esophagus.

## 2023-10-15 ENCOUNTER — Other Ambulatory Visit: Payer: Self-pay

## 2023-10-15 ENCOUNTER — Ambulatory Visit (INDEPENDENT_AMBULATORY_CARE_PROVIDER_SITE_OTHER): Payer: 59 | Admitting: Internal Medicine

## 2023-10-15 ENCOUNTER — Encounter: Payer: Self-pay | Admitting: Internal Medicine

## 2023-10-15 VITALS — BP 154/110 | HR 77 | Temp 97.4°F | Resp 14 | Ht 63.78 in | Wt 286.4 lb

## 2023-10-15 DIAGNOSIS — J329 Chronic sinusitis, unspecified: Secondary | ICD-10-CM

## 2023-10-15 DIAGNOSIS — K219 Gastro-esophageal reflux disease without esophagitis: Secondary | ICD-10-CM

## 2023-10-15 MED ORDER — AZELASTINE HCL 0.1 % NA SOLN: 2.0000 | Freq: Two times a day (BID) | NASAL | 5 refills | Status: AC | PRN

## 2023-10-15 MED ORDER — OMEPRAZOLE 20 MG PO CPDR: 20.0000 mg | DELAYED_RELEASE_CAPSULE | Freq: Every day | ORAL | 5 refills | Status: AC | PRN

## 2023-10-15 NOTE — Progress Notes (Signed)
   FOLLOW UP Date of Service/Encounter:  10/15/23   Subjective:  Mikayla Moyer (DOB: 1955-12-20) is a 67 y.o. female who [facility] for follow up for chronic rhino sinusitis, GERD and recurrent sinus infections.   History obtained from: chart review and patient. Last seen on 09/03/2023 with me for allergy testing and was negative to aeroallergen. Discussed ENT evaluation and Flonase/Azelastine.  Also on PPI daily.  Since last visit, she saw ENT Dr Ernestene Kiel on 12/10 at [facility].  Noted to have mild mucosal edema and purulence in L maxillary sinus.  Started on doxycycline and budesonide rinses.  Doing better with this with less congestion, drainage, pressure. Plan for return in 3 months with possible sinus CT is symptoms persist.    In terms of reflux, doing well on Omeprazole daily.  That does help control her heartburn and indigestion.   Past Medical History: Past Medical History:  Diagnosis Date   Hyperlipidemia    Hypertension    Osteoporosis     Objective:  BP (!) 154/110 (BP Location: Right Arm, Patient Position: Sitting, Cuff Size: Large)   Pulse 77   Temp (!) 97.4 F (36.3 C) (Temporal)   Resp 14   Ht 5' 3.78" (1.62 m)   Wt 286 lb 6.4 oz (129.9 kg)   LMP 09/28/2015   SpO2 96%   BMI 49.50 kg/m  Body mass index is 49.5 kg/m. Physical Exam: GEN: alert, well developed HEENT: clear conjunctiva, nose with mild inferior turbinate hypertrophy, pink nasal mucosa, + mucoid rhinorrhea, + slight cobblestoning HEART: regular rate and rhythm, no murmur LUNGS: clear to auscultation bilaterally, no coughing, unlabored respiration SKIN: no rashes or lesions   Assessment:   1. Chronic rhinosinusitis   2. Gastroesophageal reflux disease without esophagitis     Plan/Recommendations:  Chronic Rhinitis  Chronic Sinusitis  - Improved after abx and budesonide rinses through ENT.  - Positive skin test 08/2023: none  - Use nasal saline  rinses before nose sprays such as with Neilmed Sinus Rinse.  Use distilled water.   - Once budesonide rinses are complete, restart Flonase 2 sprays each nostril.  Aim upward and outward.   - Use Azelastine 1-2 sprays each nostril twice daily as needed for runny nose, drainage, sneezing, congestion. Aim upward and outward. - Continue follow up with ENT- started on Budesonide rinses and doxycyline at last visit. Planning for CT sinus if symptoms uncontrolled.   GERD - Controlled  - Use Omeprazole 20mg  daily in morning on an empty stomach.  Eat a small meal about 30-45 minutes after.  -Avoid lying down for at least two hours after a meal or after drinking acidic beverages, like soda, or other caffeinated beverages. This can help to prevent stomach contents from flowing back into the esophagus. -Keep your head elevated while you sleep. Using an extra pillow or two can also help to prevent reflux. -Eat smaller and more frequent meals each day instead of a few large meals. This promotes digestion and can aid in preventing heartburn. -Wear loose-fitting clothes to ease pressure on the stomach, which can worsen heartburn and reflux. -Reduce excess weight around the midsection. This can ease pressure on the stomach. Such pressure can force some stomach contents back up the esophagus.     Return in about 6 months (around 04/14/2024).  [name], MD [facility]

## 2023-10-15 NOTE — Patient Instructions (Addendum)
Chronic Rhinitis  Chronic Sinusitis  - Positive skin test 08/2023: none  - Use nasal saline rinses before nose sprays such as with Neilmed Sinus Rinse.  Use distilled water.   - Once budesonide rinses are complete, restart Flonase 2 sprays each nostril.  Aim upward and outward.   - Use Azelastine 1-2 sprays each nostril twice daily as needed for runny nose, drainage, sneezing, congestion. Aim upward and outward. - Continue follow up with ENT- started on Budesonide rinses and doxycyline at last visit. Planning for CT sinus if symptoms uncontrolled.   GERD - Use Omeprazole 20mg  daily in morning on an empty stomach.  Eat a small meal about 30-45 minutes after.  -Avoid lying down for at least two hours after a meal or after drinking acidic beverages, like soda, or other caffeinated beverages. This can help to prevent stomach contents from flowing back into the esophagus. -Keep your head elevated while you sleep. Using an extra pillow or two can also help to prevent reflux. -Eat smaller and more frequent meals each day instead of a few large meals. This promotes digestion and can aid in preventing heartburn. -Wear loose-fitting clothes to ease pressure on the stomach, which can worsen heartburn and reflux. -Reduce excess weight around the midsection. This can ease pressure on the stomach. Such pressure can force some stomach contents back up the esophagus.

## 2024-02-17 ENCOUNTER — Other Ambulatory Visit: Payer: Self-pay | Admitting: Family

## 2024-02-17 DIAGNOSIS — R5381 Other malaise: Secondary | ICD-10-CM

## 2024-02-17 DIAGNOSIS — Z1231 Encounter for screening mammogram for malignant neoplasm of breast: Secondary | ICD-10-CM

## 2024-03-13 ENCOUNTER — Ambulatory Visit
Admission: RE | Admit: 2024-03-13 | Discharge: 2024-03-13 | Disposition: A | Discharge status: Home / Self Care | Source: Ambulatory Visit | Attending: Family | Admitting: Family

## 2024-03-13 DIAGNOSIS — Z1231 Encounter for screening mammogram for malignant neoplasm of breast: Secondary | ICD-10-CM

## 2024-04-03 ENCOUNTER — Other Ambulatory Visit (HOSPITAL_BASED_OUTPATIENT_CLINIC_OR_DEPARTMENT_OTHER): Payer: Self-pay | Admitting: Family Medicine

## 2024-04-03 DIAGNOSIS — N951 Menopausal and female climacteric states: Secondary | ICD-10-CM

## 2024-04-13 NOTE — Patient Instructions (Incomplete)
 Chronic Rhinitis  Chronic Sinusitis  - Positive skin test 08/2023: none  - Use nasal saline rinses before nose sprays such as with Neilmed Sinus Rinse.  Use distilled water.   - Stop Flonase  (fluticasone ) due to frequent nose sprays   - Use Azelastine  1-2 sprays each nostril twice daily as needed for runny nose, drainage, sneezing, congestion. Aim upward and outward. - Recommend scheduling an appointment with your ENT to discuss continued sinus symptoms. Recommend CT sinus since symptoms uncontrolled.   GERD - Use Omeprazole  20mg  daily in morning on an empty stomach.  Eat a small meal about 30-45 minutes after.  -Avoid lying down for at least two hours after a meal or after drinking acidic beverages, like soda, or other caffeinated beverages. This can help to prevent stomach contents from flowing back into the esophagus. -Keep your head elevated while you sleep. Using an extra pillow or two can also help to prevent reflux. -Eat smaller and more frequent meals each day instead of a few large meals. This promotes digestion and can aid in preventing heartburn. -Wear loose-fitting clothes to ease pressure on the stomach, which can worsen heartburn and reflux. -Reduce excess weight around the midsection. This can ease pressure on the stomach. Such pressure can force some stomach contents back up the esophagus.   Epistaxis Pinch both nostrils while leaning forward for at least 5 minutes before checking to see if the bleeding has stopped. If bleeding is not controlled within 5-10 minutes apply a cotton ball soaked with oxymetazoline (Afrin) to the bleeding nostril for a few seconds.  If the problem persists or worsens a referral to ENT for further evaluation may be necessary.  Your blood pressure was very elevated today in the office. Recommend that you go to the Emergency Room due to risk of stroke and aortic dissection. She denies dizziness, shortness of breath, and chest pain  Follow up in 6 months  or sooner if needed

## 2024-04-14 ENCOUNTER — Ambulatory Visit: Payer: 59 | Admitting: Internal Medicine

## 2024-04-14 ENCOUNTER — Emergency Department (HOSPITAL_COMMUNITY)

## 2024-04-14 ENCOUNTER — Emergency Department (HOSPITAL_COMMUNITY)
Admission: EM | Admit: 2024-04-14 | Discharge: 2024-04-14 | Disposition: A | Discharge status: Home / Self Care | Attending: Emergency Medicine | Admitting: Emergency Medicine

## 2024-04-14 ENCOUNTER — Other Ambulatory Visit: Payer: Self-pay

## 2024-04-14 ENCOUNTER — Ambulatory Visit (INDEPENDENT_AMBULATORY_CARE_PROVIDER_SITE_OTHER): Admitting: Family

## 2024-04-14 ENCOUNTER — Encounter: Payer: Self-pay | Admitting: Family

## 2024-04-14 VITALS — BP 190/110 | HR 74 | Temp 98.7°F | Resp 20 | Wt 284.0 lb

## 2024-04-14 DIAGNOSIS — N950 Postmenopausal bleeding: Secondary | ICD-10-CM | POA: Diagnosis not present

## 2024-04-14 DIAGNOSIS — J329 Chronic sinusitis, unspecified: Secondary | ICD-10-CM | POA: Diagnosis not present

## 2024-04-14 DIAGNOSIS — I10 Essential (primary) hypertension: Secondary | ICD-10-CM | POA: Diagnosis not present

## 2024-04-14 DIAGNOSIS — N189 Chronic kidney disease, unspecified: Secondary | ICD-10-CM | POA: Diagnosis not present

## 2024-04-14 DIAGNOSIS — K219 Gastro-esophageal reflux disease without esophagitis: Secondary | ICD-10-CM

## 2024-04-14 DIAGNOSIS — E1122 Type 2 diabetes mellitus with diabetic chronic kidney disease: Secondary | ICD-10-CM | POA: Insufficient documentation

## 2024-04-14 DIAGNOSIS — R030 Elevated blood-pressure reading, without diagnosis of hypertension: Secondary | ICD-10-CM | POA: Diagnosis present

## 2024-04-14 DIAGNOSIS — Z79899 Other long term (current) drug therapy: Secondary | ICD-10-CM | POA: Diagnosis not present

## 2024-04-14 DIAGNOSIS — I129 Hypertensive chronic kidney disease with stage 1 through stage 4 chronic kidney disease, or unspecified chronic kidney disease: Secondary | ICD-10-CM | POA: Diagnosis not present

## 2024-04-14 LAB — CBC
HCT: 42.5 % (ref 36.0–46.0)
Hemoglobin: 14.1 g/dL (ref 12.0–15.0)
MCH: 31.3 pg (ref 26.0–34.0)
MCHC: 33.2 g/dL (ref 30.0–36.0)
MCV: 94.2 fL (ref 80.0–100.0)
Platelets: 270 10*3/uL (ref 150–400)
RBC: 4.51 MIL/uL (ref 3.87–5.11)
RDW: 13 % (ref 11.5–15.5)
WBC: 5 10*3/uL (ref 4.0–10.5)
nRBC: 0 % (ref 0.0–0.2)

## 2024-04-14 LAB — BASIC METABOLIC PANEL WITH GFR
Anion gap: 9 (ref 5–15)
BUN: 13 mg/dL (ref 8–23)
CO2: 26 mmol/L (ref 22–32)
Calcium: 9.4 mg/dL (ref 8.9–10.3)
Chloride: 104 mmol/L (ref 98–111)
Creatinine, Ser: 1.15 mg/dL — ABNORMAL HIGH (ref 0.44–1.00)
GFR, Estimated: 51 mL/min — ABNORMAL LOW (ref >60)
Glucose, Bld: 111 mg/dL — ABNORMAL HIGH (ref 70–99)
Potassium: 4 mmol/L (ref 3.5–5.1)
Sodium: 139 mmol/L (ref 135–145)

## 2024-04-14 LAB — URINALYSIS, ROUTINE W REFLEX MICROSCOPIC
Bacteria, UA: NONE SEEN
Bilirubin Urine: NEGATIVE
Glucose, UA: NEGATIVE mg/dL
Ketones, ur: NEGATIVE mg/dL
Leukocytes,Ua: NEGATIVE
Nitrite: NEGATIVE
Protein, ur: 100 mg/dL — AB
Specific Gravity, Urine: 1.008 (ref 1.005–1.030)
pH: 7 (ref 5.0–8.0)

## 2024-04-14 LAB — TROPONIN I (HIGH SENSITIVITY): Troponin I (High Sensitivity): 4 ng/L (ref <18)

## 2024-04-14 MED ORDER — HYDRALAZINE HCL 20 MG/ML IJ SOLN
20.0000 mg | Freq: Once | INTRAMUSCULAR | Status: AC
  Administered 2024-04-14: 20 mg via INTRAVENOUS
  Filled 2024-04-14: qty 1

## 2024-04-14 MED ORDER — AMLODIPINE BESYLATE 10 MG PO TABS: 10.0000 mg | ORAL_TABLET | Freq: Every day | ORAL | 1 refills | Status: AC

## 2024-04-14 MED ORDER — LABETALOL HCL 5 MG/ML IV SOLN
10.0000 mg | Freq: Once | INTRAVENOUS | Status: AC
  Administered 2024-04-14: 10 mg via INTRAVENOUS
  Filled 2024-04-14: qty 4

## 2024-04-14 NOTE — ED Triage Notes (Signed)
 Pt arrived via POV from allergy  dr due to hypertension.  Pt confirms taking HTN meds as prescribed.  Pt denies vision changes, unilateral weakness or headache.   Pt  states having spotty bloody vaginal discharge since Friday, with discomfort like her period in lower abdomen.

## 2024-04-14 NOTE — Discharge Instructions (Addendum)
 It is very important you schedule an appointment with the OBGYN for your bleeding.

## 2024-04-14 NOTE — ED Provider Notes (Signed)
 [facility]  Provider Note   CSN: [id number] Arrival date & time: 04/14/24  1010     Patient presents with: Hypertension   Mikayla Moyer is a 68 y.o. female.   Pt is a 68 yo female with pmhx significant for htn, hld, dm2, CKD, and chronic rhinitis.  Pt has been having difficulty with HTN for several months.  She has been compliant with her meds.  She has an appt with her doctor soon and the plan was to add additional meds.  Pt went to her allergy  appointment today and BP was elevated, so they told her to come here.  She has no cp, sob, headache.  She does mention that she's had some vaginal spotting.  She has not seen an obgyn in years.    Pt has been on several different blood pressure medications.  She has recently changed providers and the only one she is currently taking is Losartan 50 mg.  She said she's supposed to be on amlodipine, but it did not get sent to her house.  She has an appt with pcp next week.         Prior to Admission medications   Medication Sig Start Date End Date Taking? Authorizing Provider  albuterol (VENTOLIN HFA) 108 (90 Base) MCG/ACT inhaler Inhale 2 puffs into the lungs every 4 (four) hours as needed for wheezing or shortness of breath. 01/16/22   [provider]  amLODipine (NORVASC) 10 MG tablet Take 1 tablet (10 mg total) by mouth daily. 04/14/24   Sueellen Emery, MD  azelastine  (ASTELIN ) 0.1 % nasal spray Place 2 sprays into both nostrils 2 (two) times daily as needed for rhinitis. 10/15/23   Kandice Orleans, MD  benazepril (LOTENSIN) 5 MG tablet Take 5 mg by mouth.    [provider]  Blood Glucose Monitoring Suppl (ONETOUCH VERIO FLEX SYSTEM) w/Device KIT 1 Device by Does not apply route as directed. 01/27/21   [provider]  budesonide (PULMICORT) 0.5 MG/2ML nebulizer solution Take 0.5 mg by nebulization daily. 10/08/23   [provider]  carvedilol (COREG) 12.5 MG  tablet Take 12.5 mg by mouth 2 (two) times daily with a meal. 01/27/21   [provider]  cetirizine (ZYRTEC) 10 MG tablet Take 10 mg by mouth daily as needed. allergies Patient not taking: Reported on 04/14/2024 11/30/15   [provider]  clotrimazole-betamethasone (LOTRISONE) cream Apply 1 Application topically 2 (two) times daily. 09/18/23   [provider]  fish oil-omega-3 fatty acids 1000 MG capsule Take 1 g by mouth daily.     [provider]  fluticasone  (FLONASE ) 50 MCG/ACT nasal spray Place 2 sprays into both nostrils daily. 08/27/23   Kandice Orleans, MD  fluticasone  (VERAMYST) 27.5 MCG/SPRAY nasal spray Place 2 sprays into the nose daily as needed for rhinitis. Patient not taking: Reported on 04/14/2024    [provider]  furosemide (LASIX) 20 MG tablet Take 60 mg by mouth daily. 08/24/20   [provider]  gabapentin (NEURONTIN) 100 MG capsule Take 100 mg by mouth daily. 07/09/23   [provider]  gabapentin (NEURONTIN) 300 MG capsule Take 300 mg by mouth at bedtime.    [provider]  gabapentin (NEURONTIN) 400 MG capsule Take 400 mg by mouth daily.    [provider]  GARLIC PO Take 1 tablet by mouth as needed.    [provider]  hydrALAZINE (APRESOLINE) 50 MG tablet  Take 50 mg by mouth daily. Patient not taking: Reported on 04/14/2024 09/07/20   [provider]  hydrocortisone cream 1 % Apply 1 Application topically 2 (two) times daily. 08/24/20   [provider]  ibuprofen  (ADVIL ,MOTRIN ) 800 MG tablet  11/08/17   [provider]  losartan-hydrochlorothiazide (HYZAAR) 100-25 MG per tablet Take 1 tablet by mouth daily.    [provider]  meclizine (ANTIVERT) 25 MG tablet Take 25 mg by mouth 2 (two) times daily. 01/22/18   [provider]  meloxicam (MOBIC) 15 MG tablet Take 15 mg by mouth daily as needed for pain.    [provider]  mometasone  (ELOCON) 0.1 % cream Apply 1 application topically 2 (two) times daily as needed (finger rash).  Patient not taking: Reported on 04/14/2024    [provider]  mupirocin ointment (BACTROBAN) 2 % Apply 1 Application topically as needed (itching). 09/18/23   [provider]  nebivolol (BYSTOLIC) 5 MG tablet Take 5 mg by mouth daily.    [provider]  omeprazole  (PRILOSEC) 20 MG capsule Take 1 capsule (20 mg total) by mouth daily as needed (heartburn/ acid reflux). 10/15/23   Kandice Orleans, MD  OZEMPIC, 0.25 OR 0.5 MG/DOSE, 2 MG/3ML SOPN Inject 0.5 mg into the skin once a week.    [provider]  polyethylene glycol powder (GLYCOLAX/MIRALAX) powder Take 17 g by mouth daily as needed. constipation 12/08/15   [provider]  rosuvastatin (CRESTOR) 20 MG tablet Take 20 mg by mouth at bedtime.    [provider]    Allergies: Patient has no known allergies.    Review of Systems  Genitourinary:  Positive for vaginal bleeding.  All other systems reviewed and are negative.   Updated Vital Signs BP (!) 195/96   Pulse 78   Temp 98.3 F (36.8 C) (Oral)   Resp 14   Ht 5' 4 (1.626 m)   Wt 127 kg   LMP 09/28/2015   SpO2 100%   BMI 48.06 kg/m   Physical Exam Vitals and nursing note reviewed.  Constitutional:      Appearance: Normal appearance. She is obese.  HENT:     Head: Normocephalic and atraumatic.     Right Ear: External ear normal.     Left Ear: External ear normal.     Nose: Nose normal.     Mouth/Throat:     Mouth: Mucous membranes are moist.     Pharynx: Oropharynx is clear.   Eyes:     Extraocular Movements: Extraocular movements intact.     Conjunctiva/sclera: Conjunctivae normal.     Pupils: Pupils are equal, round, and reactive to light.    Cardiovascular:     Rate and Rhythm: Normal rate and regular rhythm.     Pulses: Normal pulses.     Heart sounds: Normal heart sounds.  Pulmonary:     Effort: Pulmonary  effort is normal.     Breath sounds: Normal breath sounds.  Abdominal:     General: Abdomen is flat.     Palpations: Abdomen is soft.   Musculoskeletal:        General: Normal range of motion.     Cervical back: Normal range of motion and neck supple.   Skin:    General: Skin is warm.     Capillary Refill: Capillary refill takes less than 2 seconds.   Neurological:     General: No focal deficit present.  Mental Status: She is alert and oriented to person, place, and time.   Psychiatric:        Mood and Affect: Mood normal.        Behavior: Behavior normal.        Thought Content: Thought content normal.        Judgment: Judgment normal.     (all labs ordered are listed, but only abnormal results are displayed) Labs Reviewed  BASIC METABOLIC PANEL WITH GFR - Abnormal; Notable for the following components:      Result Value   Glucose, Bld 111 (*)    Creatinine, Ser 1.15 (*)    GFR, Estimated 51 (*)    All other components within normal limits  URINALYSIS, ROUTINE W REFLEX MICROSCOPIC - Abnormal; Notable for the following components:   Color, Urine COLORLESS (*)    Hgb urine dipstick MODERATE (*)    Protein, ur 100 (*)    All other components within normal limits  CBC  TROPONIN I (HIGH SENSITIVITY)  TROPONIN I (HIGH SENSITIVITY)    EKG: EKG Interpretation Date/Time:  Tuesday April 14 2024 10:31:33 EDT Ventricular Rate:  73 PR Interval:  175 QRS Duration:  106 QT Interval:  391 QTC Calculation: 431 R Axis:   -1  Text Interpretation: Sinus rhythm Low voltage, precordial leads Left ventricular hypertrophy Anterior Q waves, possibly due to LVH No significant change since last tracing Confirmed by Sueellen Emery [phone]) on 04/14/2024 11:14:34 AM  Radiology: [facility] Query Chest Port 1 View Result Date: 04/14/2024 CLINICAL DATA:  Hypertension EXAM: PORTABLE CHEST 1 VIEW COMPARISON:  02/18/2013 FINDINGS: Upper normal heart size. Atherosclerotic calcification of the aortic arch.  Mild mid and lower thoracic spondylosis. The lungs appear clear.  No blunting of the costophrenic angles. IMPRESSION: 1. No acute findings. 2. Upper normal heart size. 3. Aortic Atherosclerosis (ICD10-I70.0). Electronically Signed   By: Freida Jes M.D.   On: 04/14/2024 13:06     Procedures   Medications Ordered in the ED  labetalol (NORMODYNE) injection 10 mg (10 mg Intravenous Given 04/14/24 1123)  hydrALAZINE (APRESOLINE) injection 20 mg (20 mg Intravenous Given 04/14/24 1239)                                    Medical Decision Making Amount and/or Complexity of Data Reviewed Labs: ordered. Radiology: ordered.  Risk Prescription drug management.   This patient presents to the ED for concern of htn, this involves an extensive number of treatment options, and is a complaint that carries with it a high risk of complications and morbidity.  The differential diagnosis includes end organ damage   Co morbidities that complicate the patient evaluation  htn, hld, dm2, CKD, and chronic rhinitis   Additional history obtained:  Additional history obtained from epic chart review  Lab Tests:  I Ordered, and personally interpreted labs.  The pertinent results include:  cbc nl, bmp nl other than cr sl elevated at 1.15 (cr 1.37 in 2022); ua with protein in urine   Imaging Studies ordered:  I ordered imaging studies including cxr  I independently visualized and interpreted imaging which showed  . No acute findings.  2. Upper normal heart size.  3. Aortic Atherosclerosis (ICD10-I70.0).   I agree with the radiologist interpretation   Cardiac Monitoring:  The patient was maintained on a cardiac monitor.  I personally viewed and interpreted the cardiac monitored which showed an underlying  rhythm of: nsr   Medicines ordered and prescription drug management:  I ordered medication including labetalol  and hydralazine for htn  Reevaluation of the patient after these medicines  showed that the patient improved I have reviewed the patients home medicines and have made adjustments as needed   Test Considered:  ct   Critical Interventions:  Bp med   Problem List / ED Course:  HTN:  BP has improved with labetalol and hydralazine.  She has been on amlodipine in the past and wants to go back on it.  Pt will be sent that rx.  She has an appt next week with PCP and knows to keep that appt. Post-menopausal bleeding:  pt told to make an appt with obgyn for possible endometrial bx.   Reevaluation:  After the interventions noted above, I reevaluated the patient and found that they have :improved   Social Determinants of Health:  Lives at home   Dispostion:  After consideration of the diagnostic results and the patients response to treatment, I feel that the patent would benefit from discharge with outpatient f/u.       Final diagnoses:  Postmenopausal vaginal bleeding  Hypertension, unspecified type    ED Discharge Orders          Ordered    amLODipine (NORVASC) 10 MG tablet  Daily        04/14/24 1341               Sueellen Emery, MD 04/14/24 1343

## 2024-04-14 NOTE — ED Notes (Signed)
 Pt BP trending in 200s systolic,  EDP notified, awaiting orders

## 2024-04-14 NOTE — Progress Notes (Signed)
 [address] [place] [address] Dept: [phone]  FOLLOW UP NOTE  Patient ID: Mikayla Moyer, female    DOB: [birth year]  Age: 68 y.o. MRN: [id number] Date of Office Visit: 04/14/2024  Assessment  Chief Complaint: Follow-up (allergies)  HPI Mikayla Moyer is a 68 year old female who presents today for follow-up of chronic rhinosinusitis and gastroesophageal reflux disease.  She was last seen on October 15, 2023 by Dr. Lydia Sams.  She denies any new diagnosis or surgery since her last office visit.  Chronic rhinosinusitis: She continues to have nasal congestion that is on the left side and postnasal drip.  She denies rhinorrhea.  She has a little bit of sinus tenderness and denies any fevers.  Occasionally she will have chills, but not today.  She has not been treated for any sinus infections since we last saw her.  She has been having nosebleeds frequently.  She does not notice a correlation with the use of her Flonase  nasal spray.  She is currently using fluticasone  nasal spray as needed.  Reviewed and she is using proper technique.  She is also using azelastine  nasal spray as needed.  She has previously had sinus surgery in 2010.  She is able to taste and smell.  She does not have an upcoming appointment with ear nose and throat.  At times she reports that she can feel a little lightheaded when her nose is congested and at times her ears can be stopped up.  Gastroesophageal reflux disease is reported as being good.  She continues to take omeprazole  20 mg once a day.  She reports that her blood pressures are lower at home than what they are today in the office. She last took her blood pressure medicine last night.  She denies any chest pain, dizziness today, shortness of breath.   Drug Allergies:  No Known Allergies  Review of Systems: Negative except as per HPI   Physical Exam: BP (!) 190/110   Pulse 74   Temp 98.7 F (37.1 C)   Resp 20   Wt 284 lb (128.8 kg)   LMP  09/28/2015   SpO2 98%   BMI 49.09 kg/m    Physical Exam Constitutional:      Appearance: Normal appearance.  HENT:     Head: Normocephalic and atraumatic.     Comments: Pharynx normal, eyes normal, ears normal, nose: Bilateral lower turbinates moderately edematous and pale with no drainage noted.  Left turbinate greater than right turbinate    Right Ear: Tympanic membrane, ear canal and external ear normal.     Left Ear: Tympanic membrane, ear canal and external ear normal.     Mouth/Throat:     Mouth: Mucous membranes are moist.     Pharynx: Oropharynx is clear.   Eyes:     Conjunctiva/sclera: Conjunctivae normal.    Cardiovascular:     Rate and Rhythm: Regular rhythm.     Heart sounds: Normal heart sounds.  Pulmonary:     Effort: Pulmonary effort is normal.     Breath sounds: Normal breath sounds.     Comments: Lungs clear to auscultation  Musculoskeletal:     Cervical back: Neck supple.   Skin:    General: Skin is warm.   Neurological:     Mental Status: She is alert and oriented to person, place, and time.   Psychiatric:        Mood and Affect: Mood normal.        Behavior:  Behavior normal.        Thought Content: Thought content normal.        Judgment: Judgment normal.     Diagnostics:  None  Assessment and Plan: 1. Chronic rhinosinusitis   2. Gastroesophageal reflux disease without esophagitis   3. Elevated blood pressure reading in office with diagnosis of hypertension     No orders of the defined types were placed in this encounter.   Patient Instructions  Chronic Rhinitis  Chronic Sinusitis  - Positive skin test 08/2023: none  - Use nasal saline rinses before nose sprays such as with Neilmed Sinus Rinse.  Use distilled water.   - Stop Flonase  (fluticasone ) due to frequent nose sprays   - Use Azelastine  1-2 sprays each nostril twice daily as needed for runny nose, drainage, sneezing, congestion. Aim upward and outward. - Recommend scheduling  an appointment with your ENT to discuss continued sinus symptoms. Recommend CT sinus since symptoms uncontrolled.   GERD - Use Omeprazole  20mg  daily in morning on an empty stomach.  Eat a small meal about 30-45 minutes after.  -Avoid lying down for at least two hours after a meal or after drinking acidic beverages, like soda, or other caffeinated beverages. This can help to prevent stomach contents from flowing back into the esophagus. -Keep your head elevated while you sleep. Using an extra pillow or two can also help to prevent reflux. -Eat smaller and more frequent meals each day instead of a few large meals. This promotes digestion and can aid in preventing heartburn. -Wear loose-fitting clothes to ease pressure on the stomach, which can worsen heartburn and reflux. -Reduce excess weight around the midsection. This can ease pressure on the stomach. Such pressure can force some stomach contents back up the esophagus.   Epistaxis Pinch both nostrils while leaning forward for at least 5 minutes before checking to see if the bleeding has stopped. If bleeding is not controlled within 5-10 minutes apply a cotton ball soaked with oxymetazoline (Afrin) to the bleeding nostril for a few seconds.  If the problem persists or worsens a referral to ENT for further evaluation may be necessary.  Your blood pressure was very elevated today in the office. Recommend that you go to the Emergency Room due to risk of stroke and aortic dissection. She denies dizziness, shortness of breath, and chest pain  Follow up in 6 months or sooner if needed  Return in about 6 months (around 10/14/2024), or if symptoms worsen or fail to improve.    Thank you for the opportunity to care for this patient.  Please do not hesitate to contact me with questions.  Tinnie Forehand, FNP [facility] 

## 2024-07-02 ENCOUNTER — Ambulatory Visit (HOSPITAL_BASED_OUTPATIENT_CLINIC_OR_DEPARTMENT_OTHER)
Admission: RE | Admit: 2024-07-02 | Discharge: 2024-07-02 | Disposition: A | Discharge status: Home / Self Care | Source: Ambulatory Visit | Attending: Family Medicine | Admitting: Family Medicine

## 2024-07-02 DIAGNOSIS — N951 Menopausal and female climacteric states: Secondary | ICD-10-CM | POA: Diagnosis present

## 2024-08-07 ENCOUNTER — Encounter (HOSPITAL_BASED_OUTPATIENT_CLINIC_OR_DEPARTMENT_OTHER): Payer: Self-pay | Admitting: Certified Nurse Midwife

## 2024-08-07 ENCOUNTER — Ambulatory Visit (HOSPITAL_BASED_OUTPATIENT_CLINIC_OR_DEPARTMENT_OTHER): Admitting: Certified Nurse Midwife

## 2024-08-07 ENCOUNTER — Ambulatory Visit (HOSPITAL_BASED_OUTPATIENT_CLINIC_OR_DEPARTMENT_OTHER): Admitting: Student

## 2024-08-07 ENCOUNTER — Other Ambulatory Visit (HOSPITAL_BASED_OUTPATIENT_CLINIC_OR_DEPARTMENT_OTHER): Payer: Self-pay | Admitting: Student

## 2024-08-07 ENCOUNTER — Ambulatory Visit (INDEPENDENT_AMBULATORY_CARE_PROVIDER_SITE_OTHER)

## 2024-08-07 VITALS — BP 145/99 | HR 85

## 2024-08-07 DIAGNOSIS — Y92481 Parking lot as the place of occurrence of the external cause: Secondary | ICD-10-CM

## 2024-08-07 DIAGNOSIS — W010XXA Fall on same level from slipping, tripping and stumbling without subsequent striking against object, initial encounter: Secondary | ICD-10-CM | POA: Diagnosis not present

## 2024-08-07 DIAGNOSIS — S86811S Strain of other muscle(s) and tendon(s) at lower leg level, right leg, sequela: Secondary | ICD-10-CM

## 2024-08-07 DIAGNOSIS — M25551 Pain in right hip: Secondary | ICD-10-CM

## 2024-08-07 DIAGNOSIS — M25561 Pain in right knee: Secondary | ICD-10-CM

## 2024-08-07 DIAGNOSIS — N950 Postmenopausal bleeding: Secondary | ICD-10-CM | POA: Diagnosis not present

## 2024-08-07 DIAGNOSIS — S86811A Strain of other muscle(s) and tendon(s) at lower leg level, right leg, initial encounter: Secondary | ICD-10-CM

## 2024-08-07 NOTE — Progress Notes (Signed)
 Chief Complaint: Right knee pain    Discussed the use of AI scribe software for clinical note transcription with the patient, who gave verbal consent to proceed.  History of Present Illness Mikayla Moyer is a very pleasant 68 year old female who presents with knee pain and difficulty walking following a fall.  She fell in the parking lot this morning, resulting in knee pain and difficulty walking. She attempted to use her walker but fell with it, scraping her knees and legs. She denies lightheadedness or dizziness prior to the fall. She experiences numbness and burning pain in her legs, with difficulty straightening her knee. Pain is described as severe and is accompanied by swelling. She has a history of a patellar tendon rupture in 2009, surgically repaired in 2010. The injury was due to a fall down stairs, with multiple falls occurring before diagnosis. She has minor low back problems and reports numbness in her legs but not in her back. She is aware of sciatica and nerve pain radiating down the leg. Her current medication includes Tylenol .   Surgical History:   Right knee patellar tendon repair 2010  PMH/PSH/Family History/Social History/Meds/Allergies:    Past Medical History:  Diagnosis Date   Hyperlipidemia    Hypertension    Osteoporosis    Past Surgical History:  Procedure Laterality Date   APPENDECTOMY     KNEE SURGERY     NASAL SINUS SURGERY     TONSILLECTOMY     Social History   Socioeconomic History   Marital status: Single    Spouse name: Not on file   Number of children: Not on file   Years of education: Not on file   Highest education level: Not on file  Occupational History   Not on file  Tobacco Use   Smoking status: Never    Passive exposure: Never   Smokeless tobacco: Never  Vaping Use   Vaping status: Never Used  Substance and Sexual Activity   Alcohol use: No    Alcohol/week: 0.0 standard drinks of alcohol    Drug use: No   Sexual activity: Not Currently  Other Topics Concern   Not on file  Social History Narrative   Not on file   Social Drivers of Health   Financial Resource Strain: Low Risk  (11/06/2022)   Received from [facility]   Overall Financial Resource Strain (CARDIA)    Difficulty of Paying Living Expenses: Not hard at all  Food Insecurity: No Food Insecurity (11/06/2022)   Received from [facility]   Hunger Vital Sign    Within the past 12 months, you worried that your food would run out before you got the money to buy more.: Never true    Within the past 12 months, the food you bought just didn't last and you didn't have money to get more.: Never true  Transportation Needs: No Transportation Needs (11/06/2022)   Received from [facility] - Transportation    Lack of Transportation (Medical): No    Lack of Transportation (Non-Medical): No  Physical Activity: Insufficiently Active (07/25/2022)   Received from [facility]   Exercise Vital Sign    On average, how many days per week do you engage in moderate to strenuous exercise (like a brisk walk)?: 4 days  On average, how many minutes do you engage in exercise at this level?: 30 min  Stress: No Stress Concern Present (07/25/2022)   Received from [facility] - Occupational Stress Questionnaire    Feeling of Stress : Not at all  Social Connections: Unknown (07/30/2023)   Received from [facility]   Social Network    Social Network: Not on file   Family History  Problem Relation Age of Onset   Cancer Father    Hypertension Father    Diabetes Neg Hx    Stroke Neg Hx    No Known Allergies Current Outpatient Medications  Medication Sig Dispense Refill   albuterol (VENTOLIN HFA) 108 (90 Base) MCG/ACT inhaler Inhale 2 puffs into the lungs every 4 (four) hours as needed for wheezing or shortness of breath.     amLODipine  (NORVASC ) 10 MG tablet Take 1 tablet (10 mg  total) by mouth daily. 30 tablet 1   azelastine  (ASTELIN ) 0.1 % nasal spray Place 2 sprays into both nostrils 2 (two) times daily as needed for rhinitis. 30 mL 5   benazepril (LOTENSIN) 5 MG tablet Take 5 mg by mouth.     Blood Glucose Monitoring Suppl (ONETOUCH VERIO FLEX SYSTEM) w/Device KIT 1 Device by Does not apply route as directed.     budesonide (PULMICORT) 0.5 MG/2ML nebulizer solution Take 0.5 mg by nebulization daily.     carvedilol (COREG) 12.5 MG tablet Take 12.5 mg by mouth 2 (two) times daily with a meal.     cetirizine (ZYRTEC) 10 MG tablet Take 10 mg by mouth daily as needed. allergies (Patient not taking: Reported on 04/14/2024)  5   clotrimazole-betamethasone (LOTRISONE) cream Apply 1 Application topically 2 (two) times daily.     fish oil-omega-3 fatty acids 1000 MG capsule Take 1 g by mouth daily.      fluticasone  (FLONASE ) 50 MCG/ACT nasal spray Place 2 sprays into both nostrils daily. 16 g 5   fluticasone  (VERAMYST) 27.5 MCG/SPRAY nasal spray Place 2 sprays into the nose daily as needed for rhinitis. (Patient not taking: Reported on 04/14/2024)     furosemide (LASIX) 20 MG tablet Take 60 mg by mouth daily.     gabapentin (NEURONTIN) 100 MG capsule Take 100 mg by mouth daily.     gabapentin (NEURONTIN) 300 MG capsule Take 300 mg by mouth at bedtime.     gabapentin (NEURONTIN) 400 MG capsule Take 400 mg by mouth daily.     GARLIC PO Take 1 tablet by mouth as needed.     hydrALAZINE  (APRESOLINE ) 50 MG tablet Take 50 mg by mouth daily. (Patient not taking: Reported on 04/14/2024)     hydrocortisone cream 1 % Apply 1 Application topically 2 (two) times daily.     ibuprofen  (ADVIL ,MOTRIN ) 800 MG tablet  (Patient not taking: Reported on 04/14/2024)     losartan-hydrochlorothiazide (HYZAAR) 100-25 MG per tablet Take 1 tablet by mouth daily.     meclizine (ANTIVERT) 25 MG tablet Take 25 mg by mouth 2 (two) times daily.     meloxicam (MOBIC) 15 MG tablet Take 15 mg by mouth daily as  needed for pain.     mometasone (ELOCON) 0.1 % cream Apply 1 application topically 2 (two) times daily as needed (finger rash).  (Patient not taking: Reported on 04/14/2024)     mupirocin ointment (BACTROBAN) 2 % Apply 1 Application topically as needed (itching).     nebivolol (BYSTOLIC) 5 MG tablet  Take 5 mg by mouth daily.     omeprazole  (PRILOSEC) 20 MG capsule Take 1 capsule (20 mg total) by mouth daily as needed (heartburn/ acid reflux). 30 capsule 5   OZEMPIC, 0.25 OR 0.5 MG/DOSE, 2 MG/3ML SOPN Inject 0.5 mg into the skin once a week.     polyethylene glycol powder (GLYCOLAX/MIRALAX) powder Take 17 g by mouth daily as needed. constipation  5   rosuvastatin (CRESTOR) 20 MG tablet Take 20 mg by mouth at bedtime.     No current facility-administered medications for this visit.   No results found.  Review of Systems:   A ROS was performed including pertinent positives and negatives as documented in the HPI.  Physical Exam :   Constitutional: NAD and appears stated age Neurological: Alert and oriented Psych: Appropriate affect and cooperative Last menstrual period 07/13/2024.   Comprehensive Musculoskeletal Exam:    Patient is nonweightbearing seated in a wheelchair.  Significant superior positioning of the patella without palpable patellar tendon.  Patient unable to perform a straight leg raise.  There is a prior healed anterior scar over the knee.  There is some mild tenderness in the posterior hip without lumbar midline tenderness.  Imaging:   Xray (right knee 4 views): No visualized fracture.  Patella is positioned superiorly and is not visualized on sunrise view.  There is no visualization of the patellar tendon with surrounding soft tissue edema in this area.  Xray (AP pelvis, right hip 3 views): Negative for acute fracture or dislocation.  Minimal degenerative changes within the hip joints.   I personally reviewed and interpreted the radiographs.      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  She has history of a patellar tendon repair by Dr. Barbarann back in 2010 and imaging and exam unfortunately support that she has suffered another patellar tendon rupture.  Will place her in a knee immobilizer today and plan over stat MRI of the right knee with close follow-up next week as we may need to discuss surgical repair.   Right hip pain post-fall She experiences right hip pain following a fall, but imaging shows no bony abnormalities. The hip remains stable.  Suspect that this is more low back in nature with symptoms consistent with sciatica.  Will continue to monitor symptoms and would consider imaging of the lumbar spine if symptoms persist.      I personally saw and evaluated the patient, and participated in the management and treatment plan.  Leonce Reveal, PA-C Orthopedics

## 2024-08-07 NOTE — Progress Notes (Signed)
 Patient is a 68yo female with pmhx significant for htn, hld, dm2, CKD and chronic rhinitis. Mikayla Moyer was scheduled for a new problem gyn visit with us  today; however, pt states she fell while getting out of her vehicle and appears to have injury to right knee. Someone brought patient a wheelchair. Pt states she usually walks with her walker but somehow fell over the walker.  Most recent pap 2019 (Negative with +HPV). No repeat pap since that time. Most recent Ob/Gyn visit was in 2019 with Dr. Corene due to postmenopausal bleeding. Endometrial biopsy at that time (01/10/18) was Negative.       Pt is here for postmenopausal bleeding. States I have never really stopped having periods. Reports that there have been times when she did indeed go many months without a period but then it would return. Her most recent period was one month ago. We had a long discussion today regarding recommendation for Pelvic US  and endometrial biopsy. Since pt (few minutes ago) injured her right knee and is in wheelchair, we will take her to Orthopedics for evaluation today and have her return to our office on Wednesday for ultrasound and endometrial biopsy/further evaluation and work-up. CNM not comfortable having patient try to exit from wheelchair until she has been evaluated to rule out fracture. Pt was in agreement and agrees to return Wednesday. Security was able to trasnport patient in wheelchair to Orthopedics (if Orthopedics unavailable she will be evaluated in ED at [facility]).  Pt will return 08/12/24 for US , Endometrial Biopsy, pap smear with High Risk HPV Co-Testing.  Mikayla Moyer MARLA Roller

## 2024-08-12 ENCOUNTER — Ambulatory Visit (HOSPITAL_BASED_OUTPATIENT_CLINIC_OR_DEPARTMENT_OTHER): Admitting: Student

## 2024-08-12 ENCOUNTER — Encounter (HOSPITAL_BASED_OUTPATIENT_CLINIC_OR_DEPARTMENT_OTHER): Payer: Self-pay | Admitting: Obstetrics & Gynecology

## 2024-08-12 ENCOUNTER — Ambulatory Visit (INDEPENDENT_AMBULATORY_CARE_PROVIDER_SITE_OTHER)

## 2024-08-12 ENCOUNTER — Other Ambulatory Visit (HOSPITAL_BASED_OUTPATIENT_CLINIC_OR_DEPARTMENT_OTHER)

## 2024-08-12 ENCOUNTER — Other Ambulatory Visit (HOSPITAL_COMMUNITY)
Admission: RE | Admit: 2024-08-12 | Discharge: 2024-08-12 | Disposition: A | Discharge status: Home / Self Care | Source: Ambulatory Visit | Attending: Obstetrics & Gynecology | Admitting: Obstetrics & Gynecology

## 2024-08-12 ENCOUNTER — Ambulatory Visit (HOSPITAL_BASED_OUTPATIENT_CLINIC_OR_DEPARTMENT_OTHER): Admitting: Obstetrics & Gynecology

## 2024-08-12 VITALS — BP 134/91 | HR 78

## 2024-08-12 DIAGNOSIS — Z124 Encounter for screening for malignant neoplasm of cervix: Secondary | ICD-10-CM

## 2024-08-12 DIAGNOSIS — N950 Postmenopausal bleeding: Secondary | ICD-10-CM

## 2024-08-12 DIAGNOSIS — Z1151 Encounter for screening for human papillomavirus (HPV): Secondary | ICD-10-CM | POA: Insufficient documentation

## 2024-08-12 DIAGNOSIS — B977 Papillomavirus as the cause of diseases classified elsewhere: Secondary | ICD-10-CM

## 2024-08-12 DIAGNOSIS — N840 Polyp of corpus uteri: Secondary | ICD-10-CM

## 2024-08-12 NOTE — Progress Notes (Signed)
 GYNECOLOGY  VISIT  CC:   follow up after ultrasound, h/o PMP bleeding  HPI: 68 y.o. G53P4000 Single Black or African American female here for follow-up after undergoing ultrasound due to postmenopausal bleeding.  Patient was seen on October 10 in referral from her primary care doctor.  She had an episode of bleeding in September that lasted several days.  Bleeding was not heavy.  It was dark and old in appearance.  She had some mild cramping with this as well.  Ultrasound today showed uterus measuring 8.5 x 5 x 4.6 cm.  Endometrium is a little bit irregular measures 4.5 mm.  No abnormal blood flow is noted.  The ovaries are atrophic and small.  There is no free fluid in the cervix within normal limits.  Given the mildly thickened and slightly irregular endometrium, endometrial biopsy is recommended today.  Pap smear is also planned today as she had a positive HPV test in March 2019 that was obtained with her Pap smear.   Past Medical History:  Diagnosis Date   Hyperlipidemia    Hypertension    Osteoporosis     ALLERGIES: Patient has no known allergies.  SH: Single, non-smoker  Review of Systems  Constitutional: Negative.   Genitourinary: Negative.     PHYSICAL EXAMINATION:    BP (!) 134/91 (BP Location: Right Arm, Patient Position: Sitting, Cuff Size: Large)   Pulse 78   LMP 07/13/2024 (Approximate)   SpO2 96%     General appearance: alert, cooperative and appears stated age Lymph:  no inguinal LAD noted Pelvic: External genitalia:  no lesions              Urethra:  normal appearing urethra with no masses, tenderness or lesions              Bartholins and Skenes: normal                 Vagina: normal mucosa without prolapse or lesions              Cervix: no lesions.  Pap obtained.              Endometrial biopsy recommended.  Discussed with patient.  Verbal and written consent obtained.   Procedure:  Speculum placed.  Cervix visualized and cleansed with betadine prep.  A  single toothed tenaculum was not applied to the anterior lip of the cervix.  Endometrial pipelle was advanced through the cervix into the endometrial cavity without difficulty.  Pipelle passed to 7.5cm.  Suction applied and pipelle removed with tissue that appears to be a small polyp.  Second pass performed for additional endometrial tissue.  Tenculum removed.  No bleeding noted.  Patient tolerated procedure well.  Chaperone as present for exam.  Assessment/Plan: 1. Postmenopausal bleeding (Primary) - endometrial biopsy obtained today.  Tissue that appears to be a small polyp obtained with specimen - Surgical pathology( [place]/ [facility])  2. High risk HPV infection - pap and HR HPV obtained today - Cytology - PAP( [place])  3. Cervical cancer screening

## 2024-08-13 ENCOUNTER — Inpatient Hospital Stay: Admission: RE | Admit: 2024-08-13 | Source: Ambulatory Visit

## 2024-08-13 LAB — SURGICAL PATHOLOGY

## 2024-08-14 ENCOUNTER — Ambulatory Visit (HOSPITAL_BASED_OUTPATIENT_CLINIC_OR_DEPARTMENT_OTHER): Payer: Self-pay | Admitting: Obstetrics & Gynecology

## 2024-08-14 LAB — CYTOLOGY - PAP
Adequacy: ABSENT
Comment: NEGATIVE
Diagnosis: NEGATIVE
High risk HPV: NEGATIVE

## 2024-08-19 ENCOUNTER — Ambulatory Visit (HOSPITAL_BASED_OUTPATIENT_CLINIC_OR_DEPARTMENT_OTHER): Admitting: Student

## 2024-08-26 ENCOUNTER — Ambulatory Visit
Admission: RE | Admit: 2024-08-26 | Discharge: 2024-08-26 | Disposition: A | Discharge status: Home / Self Care | Source: Ambulatory Visit | Attending: Student | Admitting: Student

## 2024-08-26 DIAGNOSIS — S86811S Strain of other muscle(s) and tendon(s) at lower leg level, right leg, sequela: Secondary | ICD-10-CM

## 2024-08-31 ENCOUNTER — Encounter: Payer: Self-pay | Admitting: Radiology

## 2024-08-31 ENCOUNTER — Ambulatory Visit (INDEPENDENT_AMBULATORY_CARE_PROVIDER_SITE_OTHER): Admitting: Student

## 2024-08-31 DIAGNOSIS — S83511A Sprain of anterior cruciate ligament of right knee, initial encounter: Secondary | ICD-10-CM

## 2024-08-31 DIAGNOSIS — S86811S Strain of other muscle(s) and tendon(s) at lower leg level, right leg, sequela: Secondary | ICD-10-CM | POA: Diagnosis not present

## 2024-08-31 DIAGNOSIS — S86811D Strain of other muscle(s) and tendon(s) at lower leg level, right leg, subsequent encounter: Secondary | ICD-10-CM | POA: Diagnosis not present

## 2024-08-31 NOTE — Progress Notes (Signed)
 Chief Complaint: Right knee pain    History of Present Illness  08/31/24: Patient presents today for follow-up of her right knee and MRI review.  She has been utilizing a walker and using the knee immobilizer for ambulation.  She states that she is unable to stand on her feet for long periods of time.  Pain has improved some however she is still unable to straighten her knee.   08/07/24: Mikayla Moyer is a very pleasant 68 year old female who presents with knee pain and difficulty walking following a fall. She fell in the parking lot this morning, resulting in knee pain and difficulty walking. She attempted to use her walker but fell with it, scraping her knees and legs. She denies lightheadedness or dizziness prior to the fall. She experiences numbness and burning pain in her legs, with difficulty straightening her knee. Pain is described as severe and is accompanied by swelling. She has a history of a patellar tendon rupture in 2009, surgically repaired in 2010. The injury was due to a fall down stairs, with multiple falls occurring before diagnosis. She has minor low back problems and reports numbness in her legs but not in her back. She is aware of sciatica and nerve pain radiating down the leg. Her current medication includes Tylenol .   Surgical History:   Right knee patellar tendon repair 2010  PMH/PSH/Family History/Social History/Meds/Allergies:    Past Medical History:  Diagnosis Date   Hyperlipidemia    Hypertension    Osteoporosis    Past Surgical History:  Procedure Laterality Date   APPENDECTOMY     KNEE SURGERY     NASAL SINUS SURGERY     TONSILLECTOMY     Social History   Socioeconomic History   Marital status: Single    Spouse name: Not on file   Number of children: Not on file   Years of education: Not on file   Highest education level: Not on file  Occupational History   Not on file  Tobacco Use   Smoking status: Never     Passive exposure: Never   Smokeless tobacco: Never  Vaping Use   Vaping status: Never Used  Substance and Sexual Activity   Alcohol use: No    Alcohol/week: 0.0 standard drinks of alcohol   Drug use: No   Sexual activity: Not Currently  Other Topics Concern   Not on file  Social History Narrative   Not on file   Social Drivers of Health   Financial Resource Strain: Low Risk  (11/06/2022)   Received from [facility]   Overall Financial Resource Strain (CARDIA)    Difficulty of Paying Living Expenses: Not hard at all  Food Insecurity: No Food Insecurity (11/06/2022)   Received from [facility]   Hunger Vital Sign    Within the past 12 months, you worried that your food would run out before you got the money to buy more.: Never true    Within the past 12 months, the food you bought just didn't last and you didn't have money to get more.: Never true  Transportation Needs: No Transportation Needs (11/06/2022)   Received from [facility] - Transportation    Lack of Transportation (Medical): No    Lack of Transportation (Non-Medical): No  Physical Activity: Insufficiently  Active (07/25/2022)   Received from [facility]   Exercise Vital Sign    On average, how many days per week do you engage in moderate to strenuous exercise (like a brisk walk)?: 4 days    On average, how many minutes do you engage in exercise at this level?: 30 min  Stress: No Stress Concern Present (07/25/2022)   Received from [facility] - Occupational Stress Questionnaire    Feeling of Stress : Not at all  Social Connections: Unknown (07/30/2023)   Received from [facility]   Social Network    Social Network: Not on file   Family History  Problem Relation Age of Onset   Cancer Father    Hypertension Father    Diabetes Neg Hx    Stroke Neg Hx    No Known Allergies Current Outpatient Medications  Medication Sig Dispense Refill   albuterol  (VENTOLIN HFA) 108 (90 Base) MCG/ACT inhaler Inhale 2 puffs into the lungs every 4 (four) hours as needed for wheezing or shortness of breath.     amLODipine  (NORVASC ) 10 MG tablet Take 1 tablet (10 mg total) by mouth daily. 30 tablet 1   azelastine  (ASTELIN ) 0.1 % nasal spray Place 2 sprays into both nostrils 2 (two) times daily as needed for rhinitis. 30 mL 5   benazepril (LOTENSIN) 5 MG tablet Take 5 mg by mouth.     Blood Glucose Monitoring Suppl (ONETOUCH VERIO FLEX SYSTEM) w/Device KIT 1 Device by Does not apply route as directed.     budesonide (PULMICORT) 0.5 MG/2ML nebulizer solution Take 0.5 mg by nebulization daily.     carvedilol (COREG) 12.5 MG tablet Take 12.5 mg by mouth 2 (two) times daily with a meal.     cetirizine (ZYRTEC) 10 MG tablet Take 10 mg by mouth daily as needed. allergies (Patient not taking: Reported on 04/14/2024)  5   clotrimazole-betamethasone (LOTRISONE) cream Apply 1 Application topically 2 (two) times daily.     fish oil-omega-3 fatty acids 1000 MG capsule Take 1 g by mouth daily.      fluticasone  (FLONASE ) 50 MCG/ACT nasal spray Place 2 sprays into both nostrils daily. 16 g 5   fluticasone  (VERAMYST) 27.5 MCG/SPRAY nasal spray Place 2 sprays into the nose daily as needed for rhinitis. (Patient not taking: Reported on 04/14/2024)     furosemide (LASIX) 20 MG tablet Take 60 mg by mouth daily.     gabapentin (NEURONTIN) 100 MG capsule Take 100 mg by mouth daily.     gabapentin (NEURONTIN) 300 MG capsule Take 300 mg by mouth at bedtime.     gabapentin (NEURONTIN) 400 MG capsule Take 400 mg by mouth daily.     GARLIC PO Take 1 tablet by mouth as needed.     hydrALAZINE  (APRESOLINE ) 50 MG tablet Take 50 mg by mouth daily. (Patient not taking: Reported on 04/14/2024)     hydrocortisone cream 1 % Apply 1 Application topically 2 (two) times daily.     ibuprofen  (ADVIL ,MOTRIN ) 800 MG tablet  (Patient not taking: Reported on 04/14/2024)     losartan-hydrochlorothiazide  (HYZAAR) 100-25 MG per tablet Take 1 tablet by mouth daily.     meclizine (ANTIVERT) 25 MG tablet Take 25 mg by mouth 2 (two) times daily.     meloxicam (MOBIC) 15 MG tablet Take 15 mg by mouth daily as needed for pain.     mometasone (ELOCON) 0.1 % cream Apply 1 application topically  2 (two) times daily as needed (finger rash).  (Patient not taking: Reported on 04/14/2024)     mupirocin ointment (BACTROBAN) 2 % Apply 1 Application topically as needed (itching).     nebivolol (BYSTOLIC) 5 MG tablet Take 5 mg by mouth daily.     omeprazole  (PRILOSEC) 20 MG capsule Take 1 capsule (20 mg total) by mouth daily as needed (heartburn/ acid reflux). 30 capsule 5   OZEMPIC, 0.25 OR 0.5 MG/DOSE, 2 MG/3ML SOPN Inject 0.5 mg into the skin once a week.     polyethylene glycol powder (GLYCOLAX/MIRALAX) powder Take 17 g by mouth daily as needed. constipation  5   rosuvastatin (CRESTOR) 20 MG tablet Take 20 mg by mouth at bedtime.     No current facility-administered medications for this visit.   No results found.  Review of Systems:   A ROS was performed including pertinent positives and negatives as documented in the HPI.  Physical Exam :   Constitutional: NAD and appears stated age Neurological: Alert and oriented Psych: Appropriate affect and cooperative Last menstrual period 07/13/2024.   Comprehensive Musculoskeletal Exam:    Patient is ambulating with assistance of a walker and a knee immobilizer.  She remains unable to perform straight leg raise.  Positive Lachman.  No significant medial or lateral joint line tenderness.   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  Complete rupture of the anterior cruciate ligament with likely small tear of the medial meniscus.   I personally reviewed and interpreted the radiographs.      Assessment & Plan Patient is a very pleasant 68 year old female who presents today for MRI review of the right knee.  She  does have history of a patellar tendon repair in 2010 which unfortunately does not appear intact on the MRI which correlates with her inability to actively extend the knee or perform a straight leg raise.  The MRI also unfortunately demonstrates a tear of the ACL which I discussed may have likely been the main cause of her injury of her fall back on 10/10 which prompted evaluation.  We discussed options for surgical versus nonsurgical management.  She is not performing any high-level physical activities although would like to be able to get back to ambulating without her walker which she was prior to the fall.  She does not have any significant comorbidities which would prevent her from being a surgical candidate.  I do believe she would benefit from patellar tendon and ACL reconstruction and patient does appear motivated to go through with this.  I will plan to have her follow-up very soon with Dr. Genelle to discuss surgical options, recovery timelines, and ensure that this is the best path for her moving forward.  Will plan to have her remain in the knee immobilizer.      I personally saw and evaluated the patient, and participated in the management and treatment plan.  Leonce Reveal, PA-C Orthopedics

## 2024-09-21 ENCOUNTER — Telehealth: Payer: Self-pay

## 2024-09-21 NOTE — Telephone Encounter (Signed)
 Patient called concerning getting scheduled for surgery.  Would like a CB.  CB# [phone].  Please advise.

## 2024-09-22 NOTE — Telephone Encounter (Signed)
 I called left vm to return myc all

## 2024-10-14 ENCOUNTER — Ambulatory Visit: Admitting: Internal Medicine

## 2024-10-14 ENCOUNTER — Other Ambulatory Visit: Payer: Self-pay

## 2024-10-14 VITALS — BP 142/92 | HR 86 | Temp 97.8°F | Ht 61.25 in | Wt 277.3 lb

## 2024-10-14 DIAGNOSIS — J329 Chronic sinusitis, unspecified: Secondary | ICD-10-CM | POA: Diagnosis not present

## 2024-10-14 DIAGNOSIS — B369 Superficial mycosis, unspecified: Secondary | ICD-10-CM

## 2024-10-14 DIAGNOSIS — K219 Gastro-esophageal reflux disease without esophagitis: Secondary | ICD-10-CM | POA: Diagnosis not present

## 2024-10-14 MED ORDER — CLOTRIMAZOLE-BETAMETHASONE 1-0.05 % EX CREA: TOPICAL_CREAM | CUTANEOUS | 0 refills | Status: DC

## 2024-10-14 MED ORDER — RYALTRIS 665-25 MCG/ACT NA SUSP: 2.0000 | Freq: Two times a day (BID) | NASAL | 5 refills | Status: AC

## 2024-10-14 MED ORDER — FEXOFENADINE HCL 180 MG PO TABS: 180.0000 mg | ORAL_TABLET | Freq: Every day | ORAL | 5 refills | Status: AC | PRN

## 2024-10-14 NOTE — Progress Notes (Signed)
 FOLLOW UP Date of Service/Encounter:  10/14/2024   Subjective:  Mikayla Moyer (DOB: [birth year]) is a 68 y.o. female who [facility] for follow up for chronic rhinosinusitis and GERD.    History obtained from: chart review and patient. Last seen on 04/14/2024 with Wanda Craze for uncontrolled CRS and discussed stopping Flonase  due to nosebleeds and using Azelastine .  Plan to follow up wit ENT.    Still notes trouble with daily congestion and post nasal drainage.  Azelastine  helps a little but does not control symptoms well.  Had to stop Flonase  due to nosebleeds.  Seen ENT 03/2024 but then did not have a CT scan scheduled as noted in their instructions.    Notes having trouble with fungal rashes under the breast.  Would like a refill of her anti fungal cream that was given by another physician.   Reflux is doing well. Denies trouble with heart burn, sour taste in mouth, AM cough. Takes omeprazole  as needed rather than daily.  Past Medical History: Past Medical History:  Diagnosis Date   Hyperlipidemia    Hypertension    Osteoporosis     Objective:  BP (!) 142/92 (BP Location: Right Arm, Patient Position: Sitting, Cuff Size: Large)   Pulse 86   Temp 97.8 F (36.6 C) (Temporal)   Ht 5' 1.25 (1.556 m)   Wt 277 lb 4.8 oz (125.8 kg)   SpO2 98%   BMI 51.97 kg/m  Body mass index is 51.97 kg/m. Physical Exam: GEN: alert, well developed HEENT: clear conjunctiva, nose with moderate inferior turbinate hypertrophy, pink nasal mucosa, + clear rhinorrhea, + cobblestoning HEART: regular rate and rhythm, no murmur LUNGS: clear to auscultation bilaterally, no coughing, unlabored respiration SKIN: erythematous papular rash under the breast bilaterally    Assessment:   1. Chronic rhinosinusitis   2. Gastroesophageal reflux disease without esophagitis   3. Fungal dermatitis     Plan/Recommendations:  Chronic Rhinitis  Chronic Sinusitis   - Uncontrolled, switch to Ryaltris  and f/u with ENT.  - Positive skin test 08/2023: none  - Use nasal saline rinses before nose sprays such as with Neilmed Sinus Rinse.  Use distilled water.   - Use Ryaltris  2 sprays each nostril twice daily.  Aim upward and outward.  - Use Allegra  (Fexofenadine ) 180mg  daily as needed for runny nose, sneezing.  - Continue follow up with ENT- planning for CT sinus with consideration for sinus surgery. Please call them to get this set up.   Luciano Elspeth Hacker, MD  Phone: tel:[phone]   GERD - Controlled  - Use Omeprazole  20mg  daily in morning on an empty stomach as needed for reflux/heartburn.  Eat a small meal about 30-45 minutes after.  -Avoid lying down for at least two hours after a meal or after drinking acidic beverages, like soda, or other caffeinated beverages. This can help to prevent stomach contents from flowing back into the esophagus. -Keep your head elevated while you sleep. Using an extra pillow or two can also help to prevent reflux. -Eat smaller and more frequent meals each day instead of a few large meals. This promotes digestion and can aid in preventing heartburn. -Wear loose-fitting clothes to ease pressure on the stomach, which can worsen heartburn and reflux. -Reduce excess weight around the midsection. This can ease pressure on the stomach. Such pressure can force some stomach contents back up the esophagus.   Fungal Dermatitis - Use Lotrisone  cream twice daily for  up to 14 days for fungal rash under the breast.      Return in about 6 months (around 04/14/2025).  Arleta Blanch, MD [facility] 

## 2024-10-14 NOTE — Patient Instructions (Addendum)
 Chronic Rhinitis  Chronic Sinusitis  - Positive skin test 08/2023: none  - Use nasal saline rinses before nose sprays such as with Neilmed Sinus Rinse.  Use distilled water.   - Use Ryaltris  2 sprays each nostril twice daily.  Aim upward and outward.  - Use Allegra  (Fexofenadine ) 180mg  daily as needed for runny nose, sneezing.  - Continue follow up with ENT- planning for CT sinus with consideration for sinus surgery. Please call them to get this set up.   Luciano Elspeth Hacker, MD  Phone: tel:[phone]   GERD - Use Omeprazole  20mg  daily in morning on an empty stomach as needed for reflux/heartburn.  Eat a small meal about 30-45 minutes after.  -Avoid lying down for at least two hours after a meal or after drinking acidic beverages, like soda, or other caffeinated beverages. This can help to prevent stomach contents from flowing back into the esophagus. -Keep your head elevated while you sleep. Using an extra pillow or two can also help to prevent reflux. -Eat smaller and more frequent meals each day instead of a few large meals. This promotes digestion and can aid in preventing heartburn. -Wear loose-fitting clothes to ease pressure on the stomach, which can worsen heartburn and reflux. -Reduce excess weight around the midsection. This can ease pressure on the stomach. Such pressure can force some stomach contents back up the esophagus.   Fungal Dermatitis - Use Lotrisone  cream twice daily for up to 14 days for fungal rash under the breast.

## 2024-11-06 ENCOUNTER — Other Ambulatory Visit: Payer: Self-pay

## 2024-11-06 DIAGNOSIS — R21 Rash and other nonspecific skin eruption: Secondary | ICD-10-CM

## 2024-11-06 MED ORDER — CLOTRIMAZOLE-BETAMETHASONE 1-0.05 % EX CREA: TOPICAL_CREAM | CUTANEOUS | 0 refills | Status: AC

## 2025-04-27 ENCOUNTER — Ambulatory Visit: Admitting: Allergy and Immunology
